# Patient Record
Sex: Female | Born: 1980 | Race: Black or African American | Hispanic: No | Marital: Single | Smoking: Never smoker
Health system: Southern US, Community
[De-identification: ages and names within clinical notes are randomized; demographics above are authoritative.]

## PROBLEM LIST (undated history)

## (undated) DIAGNOSIS — D649 Anemia, unspecified: Secondary | ICD-10-CM

## (undated) HISTORY — PX: HERNIA REPAIR: SHX51

---

## 2002-03-03 ENCOUNTER — Ambulatory Visit (HOSPITAL_COMMUNITY): Admission: RE | Admit: 2002-03-03 | Discharge: 2002-03-03 | Payer: Self-pay | Admitting: *Deleted

## 2002-08-06 ENCOUNTER — Inpatient Hospital Stay (HOSPITAL_COMMUNITY): Admission: AD | Admit: 2002-08-06 | Discharge: 2002-08-08 | Payer: Self-pay | Admitting: *Deleted

## 2005-10-17 ENCOUNTER — Emergency Department (HOSPITAL_COMMUNITY): Admission: EM | Admit: 2005-10-17 | Discharge: 2005-10-17 | Payer: Self-pay | Admitting: Emergency Medicine

## 2007-02-11 ENCOUNTER — Emergency Department (HOSPITAL_COMMUNITY): Admission: EM | Admit: 2007-02-11 | Discharge: 2007-02-11 | Payer: Self-pay | Admitting: Emergency Medicine

## 2008-07-26 ENCOUNTER — Emergency Department (HOSPITAL_COMMUNITY): Admission: EM | Admit: 2008-07-26 | Discharge: 2008-07-26 | Payer: Self-pay | Admitting: Emergency Medicine

## 2010-05-23 ENCOUNTER — Emergency Department (HOSPITAL_COMMUNITY)
Admission: EM | Admit: 2010-05-23 | Discharge: 2010-05-23 | Disposition: A | Discharge status: Home / Self Care | Payer: Medicaid Other | Attending: Emergency Medicine | Admitting: Emergency Medicine

## 2010-05-23 ENCOUNTER — Emergency Department (HOSPITAL_COMMUNITY): Payer: Medicaid Other

## 2010-05-23 DIAGNOSIS — R079 Chest pain, unspecified: Secondary | ICD-10-CM | POA: Insufficient documentation

## 2010-05-23 DIAGNOSIS — J984 Other disorders of lung: Secondary | ICD-10-CM | POA: Insufficient documentation

## 2010-05-23 LAB — URINALYSIS, ROUTINE W REFLEX MICROSCOPIC
Bilirubin Urine: NEGATIVE
Ketones, ur: 40 mg/dL — AB
Nitrite: NEGATIVE
Protein, ur: NEGATIVE mg/dL

## 2010-05-23 LAB — PREGNANCY, URINE: Preg Test, Ur: NEGATIVE

## 2010-06-10 LAB — URINALYSIS, ROUTINE W REFLEX MICROSCOPIC
Bilirubin Urine: NEGATIVE
Glucose, UA: NEGATIVE mg/dL
Hgb urine dipstick: NEGATIVE
Protein, ur: NEGATIVE mg/dL
Specific Gravity, Urine: 1.025 (ref 1.005–1.030)
Urobilinogen, UA: 1 mg/dL (ref 0.0–1.0)

## 2010-06-10 LAB — WET PREP, GENITAL
Trich, Wet Prep: NONE SEEN
Yeast Wet Prep HPF POC: NONE SEEN

## 2010-06-10 LAB — GC/CHLAMYDIA PROBE AMP, GENITAL
Chlamydia, DNA Probe: NEGATIVE
GC Probe Amp, Genital: NEGATIVE
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
## 2010-07-18 NOTE — Discharge Summary (Signed)
Daisy [name], [name]                            ACCOUNT NO.:  [id number]   MEDICAL RECORD NO.:  [id number]                   PATIENT TYPE:  INP   LOCATION:  9121                                 [facility]ILITY:  [facility]   PHYSICIAN:  Hal Morales, M.D.             DATE OF BIRTH:  10-21-1980   DATE OF ADMISSION:  08/06/2002  DATE OF DISCHARGE:  08/08/2002                                 DISCHARGE SUMMARY   ADMISSION DIAGNOSES:  1. Intrauterine pregnancy at 41 weeks.  2. Early labor.  3. Very difficult pelvic examination.   DISCHARGE DIAGNOSES:  1. Intrauterine pregnancy at 41 weeks.  2. Early labor.  3. Very difficult pelvic examination.  4. Status post vacuum assisted vaginal delivery of a viable female infant,     bottle feeding, and desires Ortho-Evra patch for contraception.   PROCEDURE:  Vacuum assisted vaginal delivery of a viable female infant with  Apgars of 7 and 8 who weighed 7 pounds 8 ounces on August 06, 2002 attended by  Naima A. Dillard, M.D. and Rica Koyanagi, C.N.M.   HOSPITAL COURSE:  The patient is a 30 year old single black female gravida  2, para 0-0-1-0 at [redacted] weeks gestation who presented complaining of uterine  contractions every three to four minutes for the last several hours.  She  was very difficult to examine secondary to her discomfort, but appeared to  be 1-2 cm, 75% effaced, and vertex but patient was admitted secondary to  being 41 weeks and very uncomfortable with her uterine contractions.  She  was given an epidural for labor comfort and to facilitate pelvic  examination.  The patient progressed to 4-5 cm by 7 a.m. on August 06, 2002 and  by 12:30 a.m. was complete at a +1 station and started pushing.  She pushed  for over four hours with minimal progress and the last hour had a quarter  size of the fetal head visible at the introitus for greater than an hour and  was exhausted.  At this point the patient was offered a vacuum assisted  vaginal  delivery by Naima A. Dillard, M.D. which she accepted and underwent  the same for delivery of a viable female infant who had Apgars of 7 and 8  and weighed 7 pounds 8 ounces.  Postpartum the patient has done well.  She  is now ambulating, voiding, and eating without difficulty.  She denies any  syncopal episodes or dizziness.  She is bottle feeding and plans Ortho-Evra  for contraception.  Her vital signs have been stable and she has been  afebrile throughout her postpartum stay.  She is deemed ready for discharge  today.   DISCHARGE INSTRUCTIONS:  As per the [facility] OB[facility] handout.   DISCHARGE MEDICATIONS:  1. Motrin 600 mg p.o. q.6h. p.r.n. for pain in liquid form as patient's     preference.  2. Tylenol No.3 suspension  for pain.  3. Ortho-Evra patch for contraception.    DISCHARGE FOLLOWUP:  Six weeks at [facility] or p.r.n.   DISCHARGE LABORATORIES:  Her hemoglobin is 7.9, WBC count 15.8, platelets  290,000.     Daisy [name], C.N.M.              Hal Morales, M.D.    SJD/MEDQ  D:  08/08/2002  T:  08/08/2002  Job:  [id number]

## 2010-11-27 ENCOUNTER — Emergency Department (HOSPITAL_COMMUNITY)
Admission: EM | Admit: 2010-11-27 | Discharge: 2010-11-27 | Disposition: A | Discharge status: Home / Self Care | Payer: Medicaid Other | Attending: Emergency Medicine | Admitting: Emergency Medicine

## 2010-11-27 ENCOUNTER — Emergency Department (HOSPITAL_COMMUNITY): Payer: Medicaid Other

## 2010-11-27 DIAGNOSIS — R079 Chest pain, unspecified: Secondary | ICD-10-CM | POA: Insufficient documentation

## 2010-11-27 DIAGNOSIS — R918 Other nonspecific abnormal finding of lung field: Secondary | ICD-10-CM | POA: Insufficient documentation

## 2010-11-27 DIAGNOSIS — K219 Gastro-esophageal reflux disease without esophagitis: Secondary | ICD-10-CM | POA: Insufficient documentation

## 2010-11-27 DIAGNOSIS — R000 Tachycardia, unspecified: Secondary | ICD-10-CM | POA: Insufficient documentation

## 2010-11-27 DIAGNOSIS — R059 Cough, unspecified: Secondary | ICD-10-CM | POA: Insufficient documentation

## 2010-11-27 DIAGNOSIS — R05 Cough: Secondary | ICD-10-CM | POA: Insufficient documentation

## 2010-11-27 LAB — POCT I-STAT, CHEM 8
Creatinine, Ser: 0.7 mg/dL (ref 0.50–1.10)
Glucose, Bld: 88 mg/dL (ref 70–99)
HCT: 43 % (ref 36.0–46.0)
Hemoglobin: 14.6 g/dL (ref 12.0–15.0)
Potassium: 3.9 mEq/L (ref 3.5–5.1)
TCO2: 24 mmol/L (ref 0–100)

## 2010-11-27 MED ORDER — IOHEXOL 300 MG/ML  SOLN
80.0000 mL | Freq: Once | INTRAMUSCULAR | Status: AC | PRN
  Administered 2010-11-27: 80 mL via INTRAVENOUS

## 2010-12-08 LAB — DIFFERENTIAL
Basophils Relative: 1
Eosinophils Absolute: 0.2
Lymphs Abs: 3
Neutro Abs: 6.7
Neutrophils Relative %: 62

## 2010-12-08 LAB — CBC
HCT: 38
Hemoglobin: 13
MCHC: 34.2
MCV: 88.2
RBC: 4.31
RDW: 12.9

## 2010-12-08 LAB — COMPREHENSIVE METABOLIC PANEL
ALT: 11
BUN: 6
CO2: 27
Calcium: 9.6
GFR calc non Af Amer: >60
Glucose, Bld: 108 — ABNORMAL HIGH
Sodium: 136
Total Protein: 7.6

## 2010-12-08 LAB — URINALYSIS, ROUTINE W REFLEX MICROSCOPIC
Bilirubin Urine: NEGATIVE
Glucose, UA: NEGATIVE
Hgb urine dipstick: NEGATIVE
Ketones, ur: NEGATIVE
Protein, ur: NEGATIVE
Urobilinogen, UA: 1

## 2010-12-08 LAB — WET PREP, GENITAL: Yeast Wet Prep HPF POC: NONE SEEN

## 2010-12-08 LAB — GC/CHLAMYDIA PROBE AMP, GENITAL
Chlamydia, DNA Probe: NEGATIVE
GC Probe Amp, Genital: NEGATIVE

## 2010-12-08 LAB — LIPASE, BLOOD: Lipase: 46

## 2012-12-18 IMAGING — CT CT CHEST W/ CM
1 of 2 series · 14 of 29 positions shown, 18 images · IV contrast (APPLIED)
Comparison: Chest x-ray 11/27/2010

CLINICAL DATA: Cough, chest pain.

CT CHEST WITH CONTRAST
TECHNIQUE: Multidetector CT imaging of the chest was performed
following the standard protocol during bolus administration of
intravenous contrast.
Contrast: 80mL OMNIPAQUE IOHEXOL 300 MG/ML IV SOLN

[Series 2: chest with st · axial · 0.69mm/px · z∈[+1040,+1236]mm · 14 of 47 slices shown, 18 images]
[im 4/47  mediastinal]
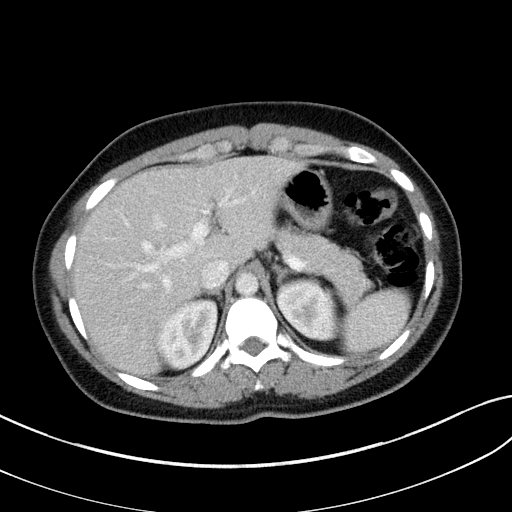
[im 4/47  lung]
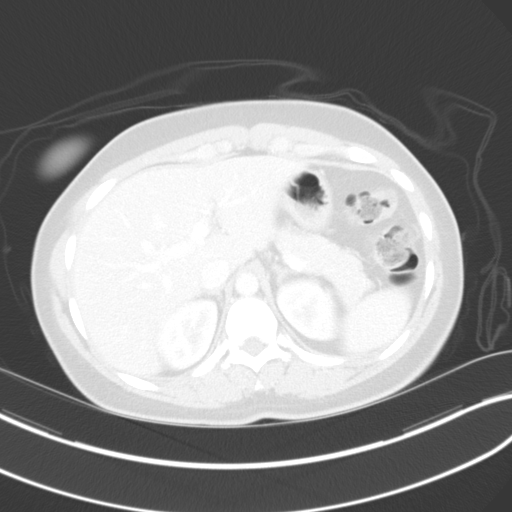
[im 7/47  lung]
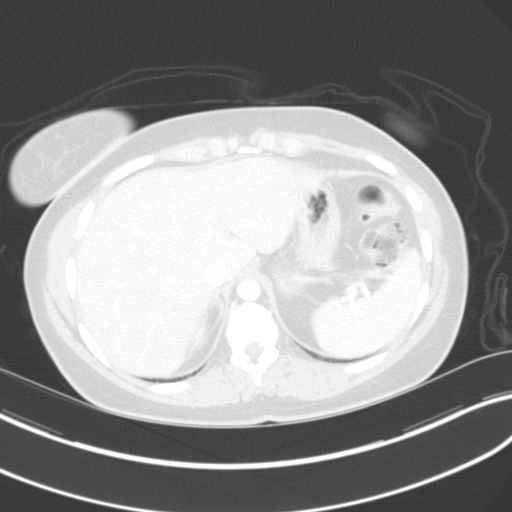
[im 10/47  lung]
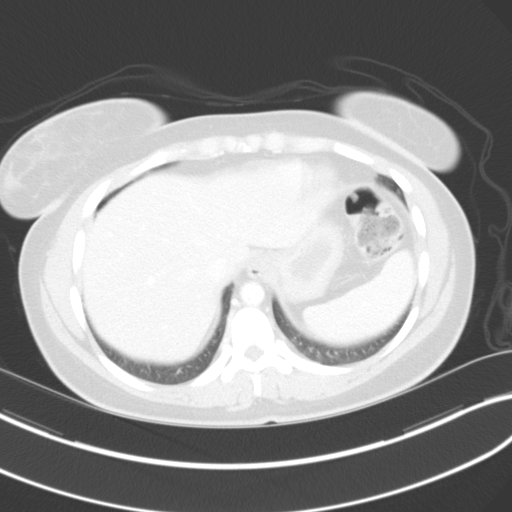
[im 14/47  lung]
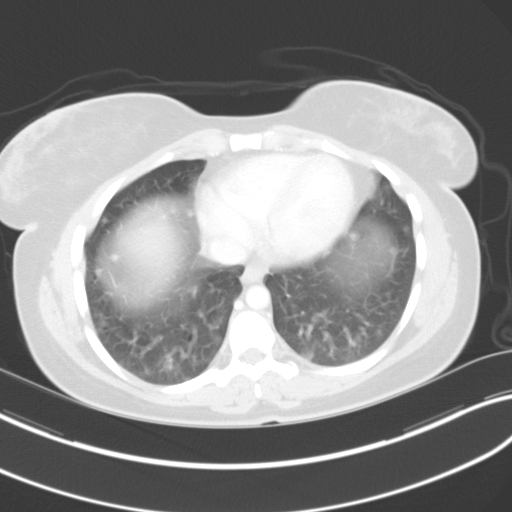
[im 17/47  mediastinal]
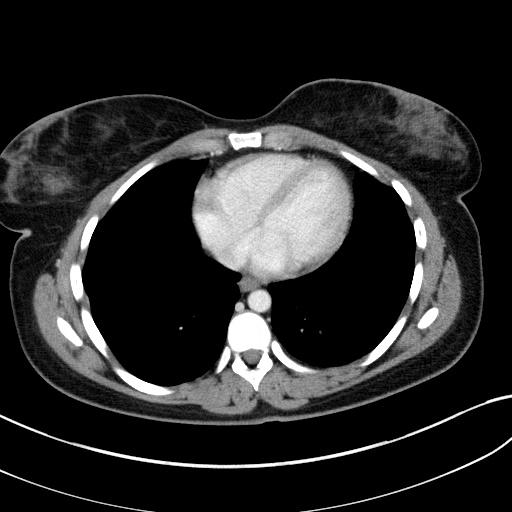
[im 17/47  lung]
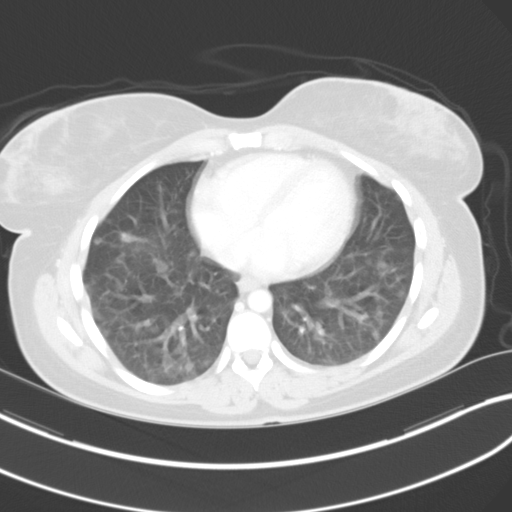
[im 20/47  lung]
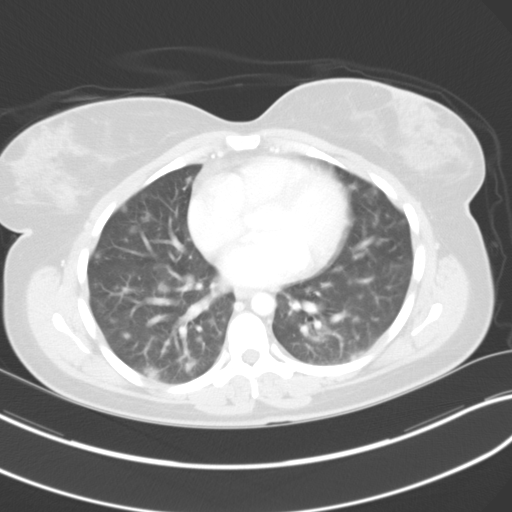
[im 22/47  lung]
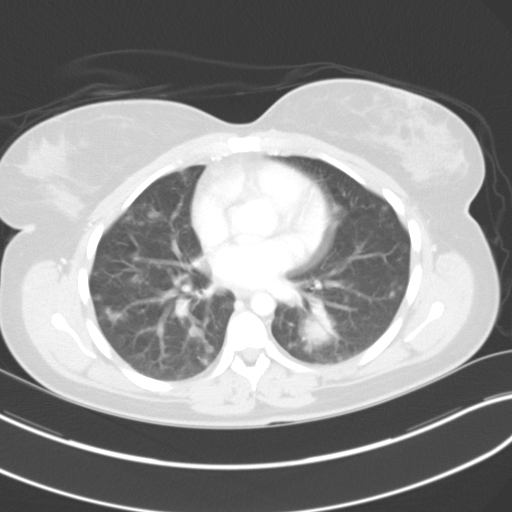
[im 24/47  lung]
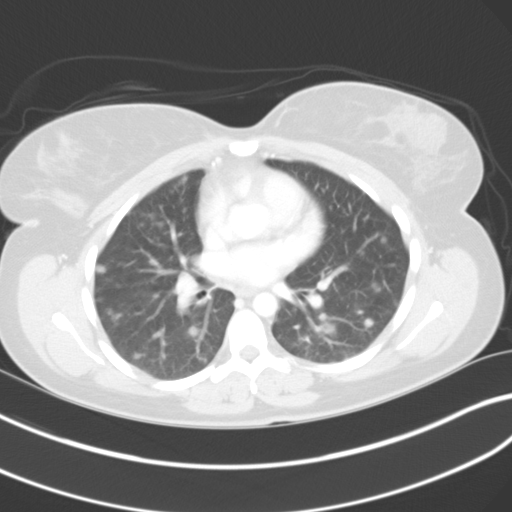
[im 27/47  mediastinal]
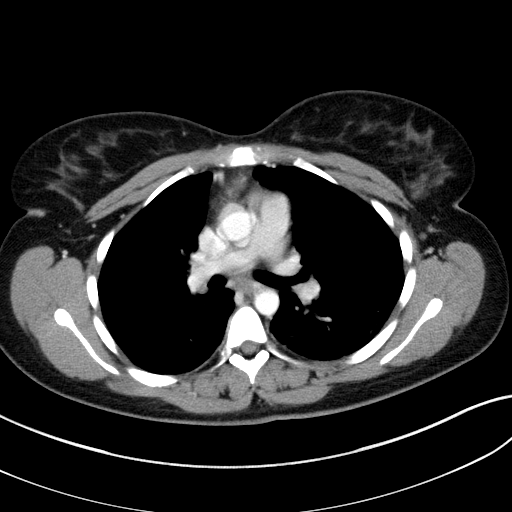
[im 27/47  lung]
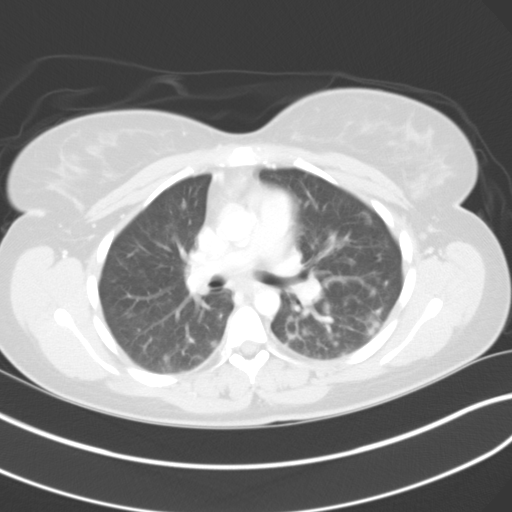
[im 30/47  lung]
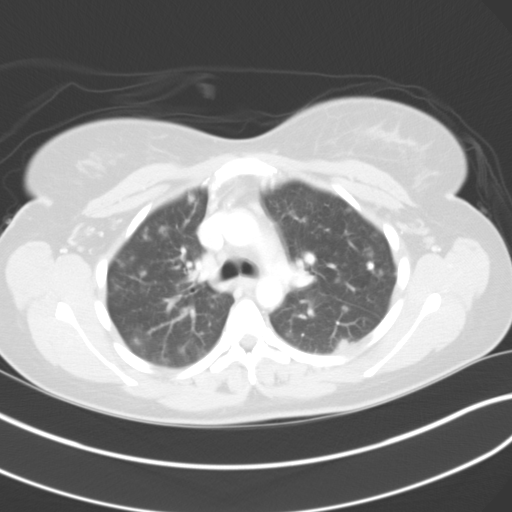
[im 33/47  lung]
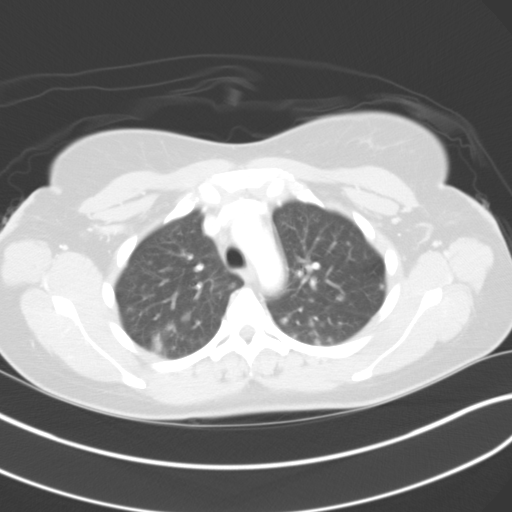
[im 37/47  lung]
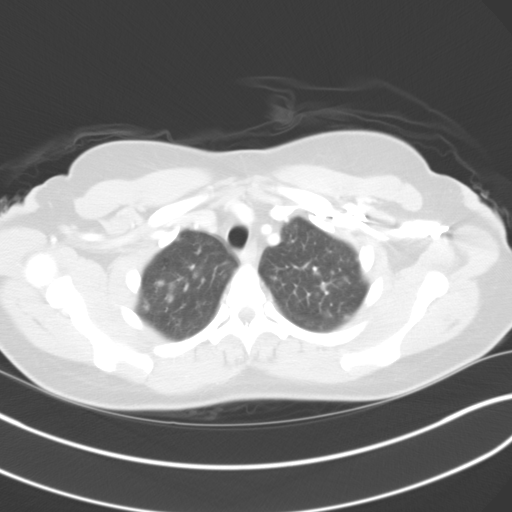
[im 40/47  mediastinal]
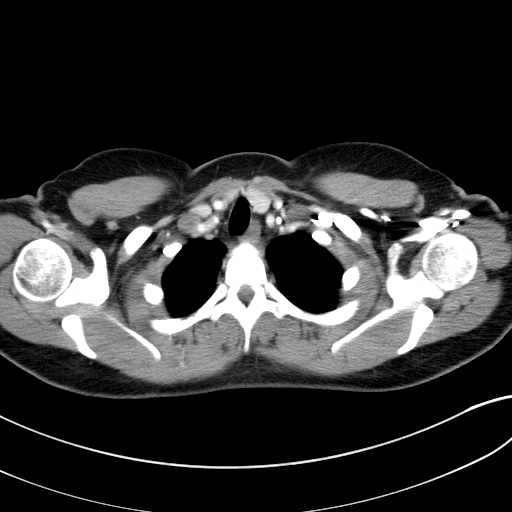
[im 40/47  lung]
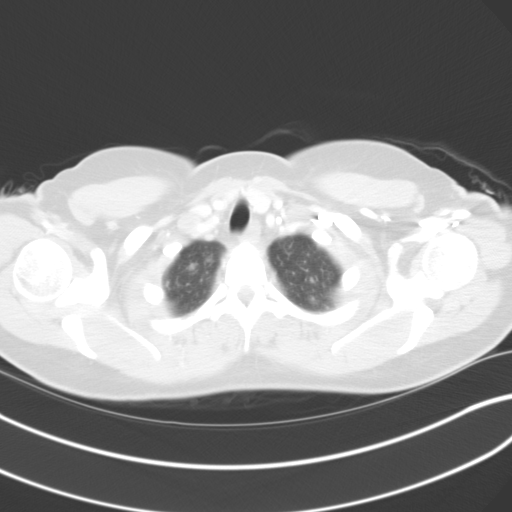
[im 43/47  lung]
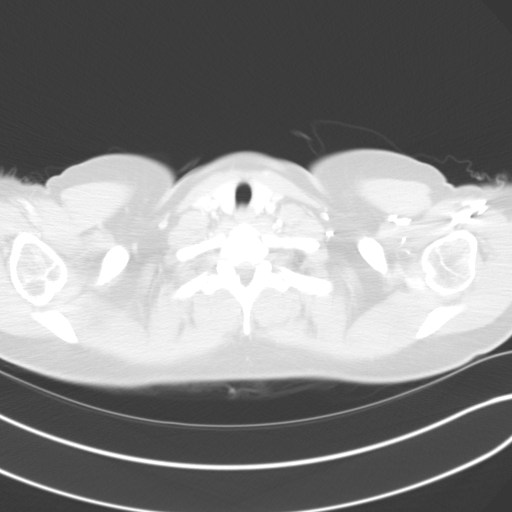

[14 of 29 positions shown; findings below may reference images not displayed]

FINDINGS: There are numerous bilateral pulmonary nodules.  The
largest is in the left lower lobe measuring 2.3 cm on image 26.
These are scattered throughout both lungs involving all lobes.
These nodules are nonspecific and could be inflammatory, infectious
or neoplastic.  Calcified granuloma noted in the upper lobes
bilaterally.  No pleural effusions. Heart is normal size. Aorta is
normal caliber. No mediastinal, hilar, or axillary adenopathy.
Visualized thyroid and chest wall soft tissues unremarkable.
Imaging into the upper abdomen shows no acute findings.
IMPRESSION: Numerous bilateral pulmonary nodules, most of which are
subcentimeteric in size.  The largest is 2.2 cm in the left lower
lobe.  These are nonspecific.  I favor these are infectious or
inflammatory, but metastases cannot be excluded.  No associated
additional abnormality.

## 2012-12-27 ENCOUNTER — Ambulatory Visit
Admission: RE | Admit: 2012-12-27 | Discharge: 2012-12-27 | Disposition: A | Discharge status: Home / Self Care | Payer: No Typology Code available for payment source | Source: Ambulatory Visit | Attending: Infectious Disease | Admitting: Infectious Disease

## 2012-12-27 ENCOUNTER — Other Ambulatory Visit: Payer: Self-pay | Admitting: Infectious Disease

## 2012-12-27 DIAGNOSIS — R7611 Nonspecific reaction to tuberculin skin test without active tuberculosis: Secondary | ICD-10-CM

## 2013-12-22 ENCOUNTER — Emergency Department (HOSPITAL_COMMUNITY)
Admission: EM | Admit: 2013-12-22 | Discharge: 2013-12-22 | Disposition: A | Discharge status: Home / Self Care | Payer: Medicaid Other | Attending: Emergency Medicine | Admitting: Emergency Medicine

## 2013-12-22 ENCOUNTER — Encounter (HOSPITAL_COMMUNITY): Payer: Self-pay | Admitting: Emergency Medicine

## 2013-12-22 DIAGNOSIS — J029 Acute pharyngitis, unspecified: Secondary | ICD-10-CM | POA: Insufficient documentation

## 2013-12-22 LAB — RAPID STREP SCREEN (MED CTR MEBANE ONLY): Streptococcus, Group A Screen (Direct): NEGATIVE

## 2013-12-22 MED ORDER — HYDROCODONE-ACETAMINOPHEN 7.5-325 MG/15ML PO SOLN: 15.0000 mL | ORAL | Status: DC | PRN

## 2013-12-22 MED ORDER — LIDOCAINE VISCOUS 2 % MT SOLN: 20.0000 mL | OROMUCOSAL | Status: DC | PRN

## 2013-12-22 NOTE — Discharge Instructions (Signed)

## 2013-12-22 NOTE — ED Provider Notes (Signed)
Medical screening examination/treatment/procedure(s) were performed by non-physician practitioner and as supervising physician I was immediately available for consultation/collaboration.   EKG Interpretation None      Devoria AlbeIva Jules Vidovich, MD, Armando GangFACEP   Ward GivensIva L Ladislaus Repsher, MD 12/22/13 [phone]

## 2013-12-22 NOTE — ED Provider Notes (Signed)
CSN: [id number]     Arrival date & time 12/22/13  1725 History   First MD Initiated Contact with Patient 12/22/13 1737     Chief Complaint  Patient presents with  . Sore Throat     (Consider location/radiation/quality/duration/timing/severity/associated sxs/prior Treatment) HPI  Patient to the ED with complaitns of sore throat for two days. She has tried OTC flu medication without relief. She reports the pain is severe. Denies throat swelling or being unable to tolerate secretions. She denies lateralization of pain. Denies fevers, chills, n/v/d.  History reviewed. No pertinent past medical history. Past Surgical History  Procedure Laterality Date  . Hernia repair     No family history on file. History  Substance Use Topics  . Smoking status: Never Smoker   . Smokeless tobacco: Not on file  . Alcohol Use: No   OB History   Grav Para Term Preterm Abortions TAB SAB Ect Mult Living                 Review of Systems   Review of Systems  Gen: no weight loss, fevers, chills, night sweats  Eyes: no occular draining, occular pain,  No visual changes  Nose: no epistaxis or rhinorrhea  Mouth: no dental pain, + sore throat  Neck: no neck pain  Lungs: No hemoptysis. No wheezing or coughing CV:  No palpitations, dependent edema or orthopnea. No chest pain Abd: no diarrhea. No nausea or vomiting, No abdominal pain  GU: no dysuria or gross hematuria  MSK:  No muscle weakness, No muscular pain Neuro: no headache, no focal neurologic deficits  Skin: no rash , no wounds Psyche: no complaints of depression or anxiety    Allergies  Review of patient's allergies indicates no known allergies.  Home Medications   Prior to Admission medications   Medication Sig Start Date End Date Taking? Authorizing Provider  HYDROcodone-acetaminophen (HYCET) 7.5-325 mg/15 ml solution Take 15 mLs by mouth every 4 (four) hours as needed for moderate pain or severe pain. 12/22/13   Sheleen Conchas Irine SealG Castle Lamons,  PA-C  lidocaine (XYLOCAINE) 2 % solution Use as directed 20 mLs in the mouth or throat as needed for mouth pain. 12/22/13   Sophea Rackham Irine SealG Iridessa Harrow, PA-C   BP 144/87  Pulse 93  Temp(Src) 98.7 F (37.1 C) (Oral)  Resp 18  SpO2 100%  LMP 12/22/2013 Physical Exam  Nursing note and vitals reviewed. Constitutional: She is oriented to person, place, and time. She appears well-developed and well-nourished. No distress.  HENT:  Head: Normocephalic and atraumatic. Head is without raccoon's eyes, without Battle's sign, without abrasion, without contusion, without laceration, without right periorbital erythema and without left periorbital erythema.  Right Ear: Tympanic membrane, external ear and ear canal normal. No hemotympanum.  Left Ear: Tympanic membrane, external ear and ear canal normal.  Nose: Nose normal. No rhinorrhea. Right sinus exhibits no maxillary sinus tenderness and no frontal sinus tenderness. Left sinus exhibits no maxillary sinus tenderness and no frontal sinus tenderness.  Mouth/Throat: Uvula is midline and mucous membranes are normal. No trismus in the jaw. Normal dentition. No dental abscesses or uvula swelling. No oropharyngeal exudate, posterior oropharyngeal edema, posterior oropharyngeal erythema or tonsillar abscesses.  No submental edema, tongue not elevated, no trismus. No impending airway obstruction; Pt able to speak full sentences, swallow intact, no drooling, stridor, or tonsillar/uvula displacement. No palatal petechia  Eyes: Conjunctivae and EOM are normal. Pupils are equal, round, and reactive to light.  Neck: Trachea normal, normal range of  motion and full passive range of motion without pain. Neck supple. No spinous process tenderness and no muscular tenderness present. No rigidity. Normal range of motion present. No Brudzinski's sign noted.  Flexion and extension of neck without pain or difficulty. Able to breath without difficulty in extension.  Cardiovascular: Normal  rate and regular rhythm.   Pulmonary/Chest: Effort normal and breath sounds normal. No stridor. No respiratory distress. She has no decreased breath sounds. She has no wheezes.  Abdominal: Soft. Bowel sounds are normal. There is no tenderness. There is no guarding.  No obvious evidence of splenomegaly. Non ttp.   Musculoskeletal: Normal range of motion.  Lymphadenopathy:       Head (right side): No preauricular and no posterior auricular adenopathy present.       Head (left side): No preauricular and no posterior auricular adenopathy present.    She has cervical adenopathy.  Neurological: She is alert and oriented to person, place, and time.  Skin: Skin is warm and dry. No rash noted. She is not diaphoretic.  Psychiatric: She has a normal mood and affect. Her speech is normal.    ED Course  Procedures (including critical care time) Labs Review Labs Reviewed  RAPID STREP SCREEN  CULTURE, GROUP A STREP    Imaging Review No results found.   EKG Interpretation None      MDM   Final diagnoses:  Pharyngitis    Negative strep screen. Normal PE. Will treat patients pain. Advised to use supportive care.  33 y.o.Edee Weichert's evaluation in the Emergency Department is complete. It has been determined that no acute conditions requiring further emergency intervention are present at this time. The patient/guardian have been advised of the diagnosis and plan. We have discussed signs and symptoms that warrant return to the ED, such as changes or worsening in symptoms.  Vital signs are stable at discharge. Filed Vitals:   12/22/13 1735  BP: 144/87  Pulse: 93  Temp: 98.7 F (37.1 C)  Resp: 18    Patient/guardian has voiced understanding and agreed to follow-up with the PCP or specialist.    Dorthula Matasiffany G Nealy Karapetian, PA-C 12/22/13 1917

## 2013-12-22 NOTE — ED Notes (Signed)
Per pt, states sore throat for three days

## 2013-12-24 LAB — CULTURE, GROUP A STREP

## 2015-01-18 IMAGING — CR DG CHEST 1V
1 series · 1 of 1 positions shown · non-contrast
Comparison: CT chest dated 11/27/2010

CLINICAL DATA: Positive TB test

EXAM:
CHEST - 1 VIEW

[w chest pa *]
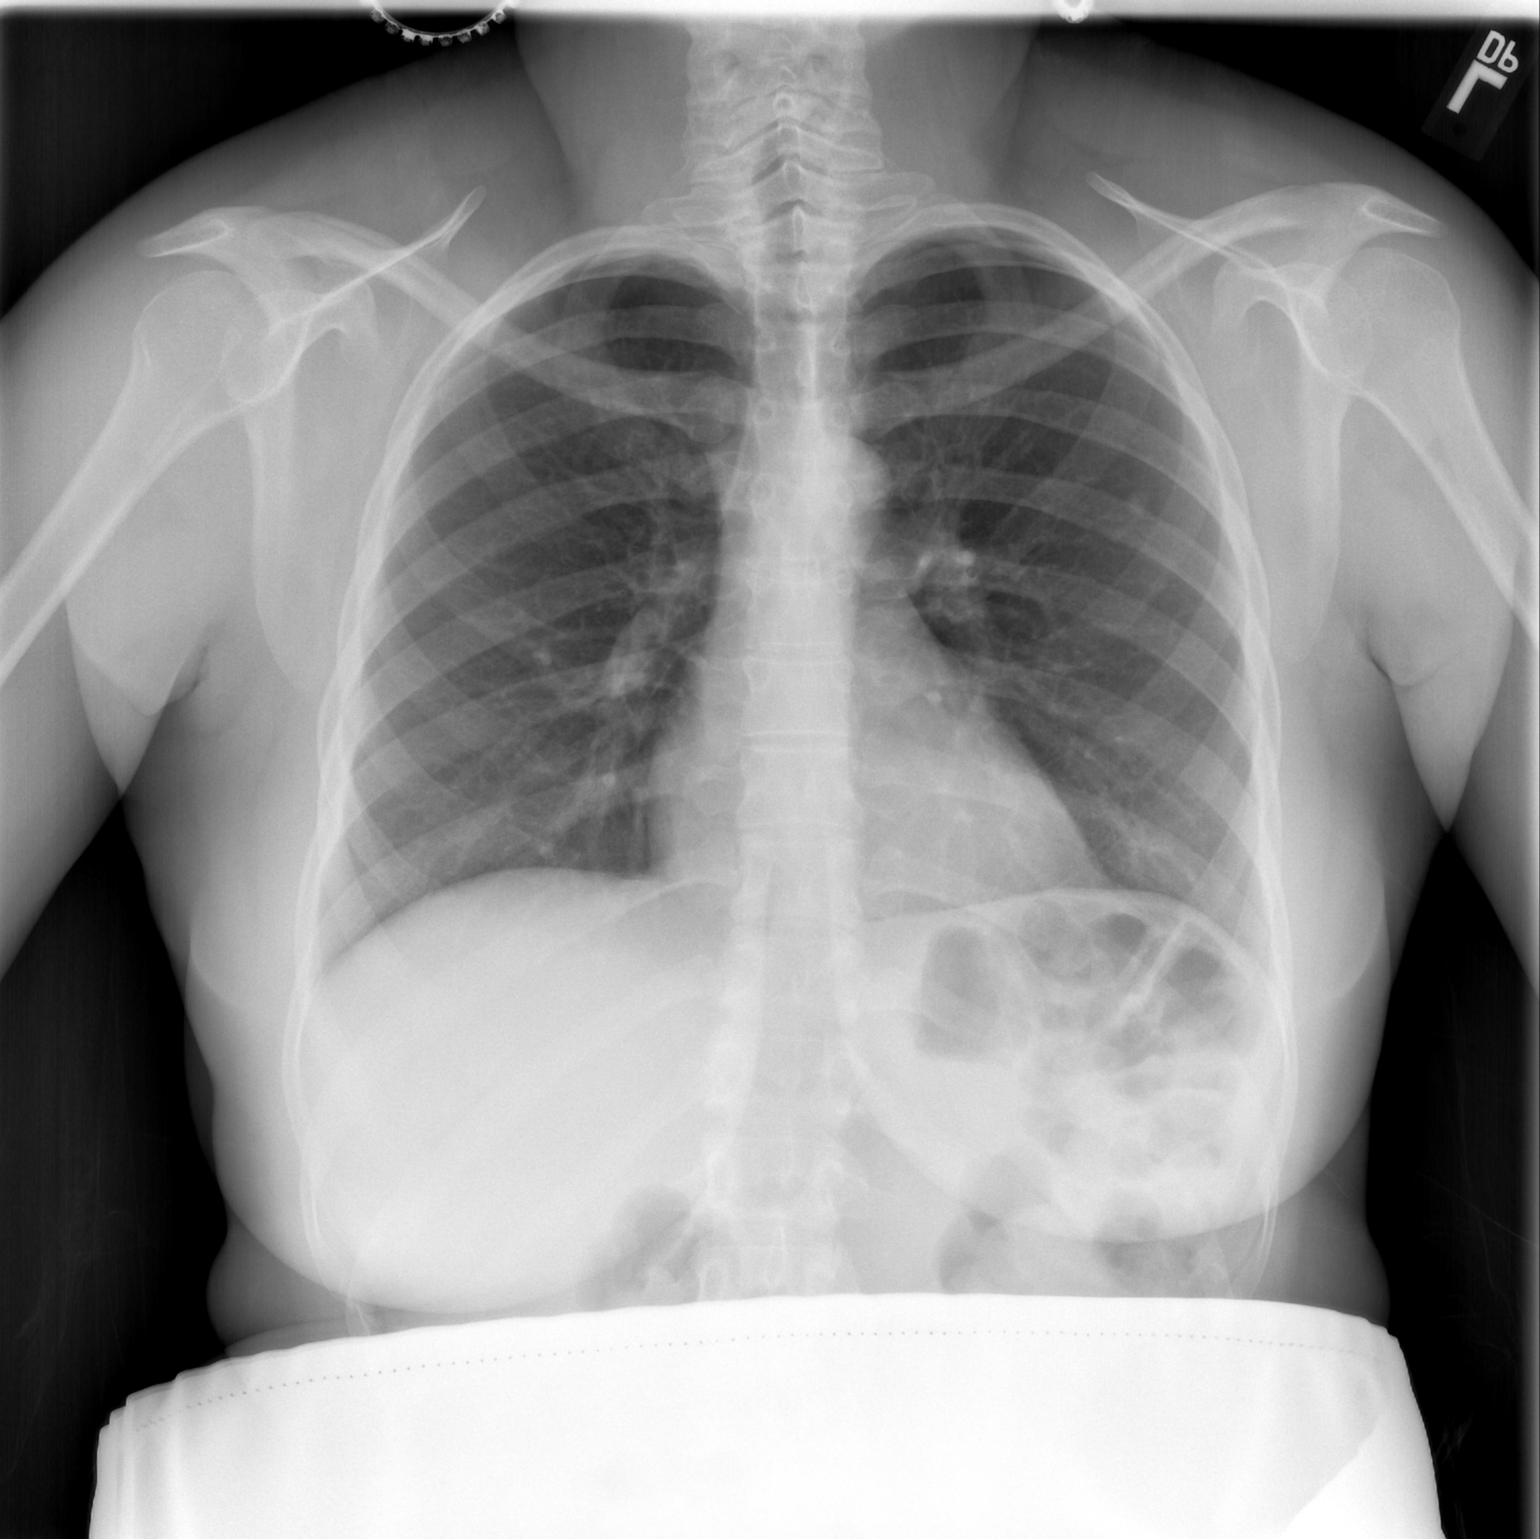

[1 of 1 positions shown; findings below may reference images not displayed]

FINDINGS: 8 mm left upper lobe pulmonary nodule. This is favored to reflect a
calcified granuloma when correlating with prior CT. Additional
numerous bilateral pulmonary nodules have resolved.

Lungs otherwise clear. No pleural effusion or pneumothorax.

The heart is normal in size.
IMPRESSION: No evidence of acute cardiopulmonary disease.

8 mm left upper lobe pulmonary nodule, favored to reflect a
calcified granuloma when correlating with prior CT.

## 2017-06-15 ENCOUNTER — Ambulatory Visit: Payer: Medicaid Other | Admitting: Physician Assistant

## 2017-06-16 ENCOUNTER — Encounter: Payer: Self-pay | Admitting: Physician Assistant

## 2019-12-07 LAB — HM HEPATITIS C SCREENING LAB: HM Hepatitis Screen: NEGATIVE

## 2020-12-05 DIAGNOSIS — Z62820 Parent-biological child conflict: Secondary | ICD-10-CM | POA: Diagnosis not present

## 2020-12-05 DIAGNOSIS — F4321 Adjustment disorder with depressed mood: Secondary | ICD-10-CM | POA: Diagnosis not present

## 2020-12-12 DIAGNOSIS — Z62820 Parent-biological child conflict: Secondary | ICD-10-CM | POA: Diagnosis not present

## 2020-12-12 DIAGNOSIS — F4321 Adjustment disorder with depressed mood: Secondary | ICD-10-CM | POA: Diagnosis not present

## 2021-01-02 DIAGNOSIS — F4321 Adjustment disorder with depressed mood: Secondary | ICD-10-CM | POA: Diagnosis not present

## 2021-01-02 DIAGNOSIS — Z62820 Parent-biological child conflict: Secondary | ICD-10-CM | POA: Diagnosis not present

## 2021-01-09 ENCOUNTER — Other Ambulatory Visit (HOSPITAL_COMMUNITY)
Admission: RE | Admit: 2021-01-09 | Discharge: 2021-01-09 | Disposition: A | Discharge status: Home / Self Care | Payer: BLUE CROSS/BLUE SHIELD | Source: Ambulatory Visit | Attending: Nurse Practitioner | Admitting: Nurse Practitioner

## 2021-01-09 ENCOUNTER — Encounter: Payer: Self-pay | Admitting: Nurse Practitioner

## 2021-01-09 ENCOUNTER — Other Ambulatory Visit: Payer: Self-pay

## 2021-01-09 ENCOUNTER — Ambulatory Visit (INDEPENDENT_AMBULATORY_CARE_PROVIDER_SITE_OTHER): Payer: BLUE CROSS/BLUE SHIELD | Admitting: Nurse Practitioner

## 2021-01-09 VITALS — BP 110/80 | HR 85 | Temp 97.9°F | Ht 62.75 in | Wt 191.2 lb

## 2021-01-09 DIAGNOSIS — Z113 Encounter for screening for infections with a predominantly sexual mode of transmission: Secondary | ICD-10-CM

## 2021-01-09 DIAGNOSIS — B9689 Other specified bacterial agents as the cause of diseases classified elsewhere: Secondary | ICD-10-CM | POA: Diagnosis not present

## 2021-01-09 DIAGNOSIS — Z62820 Parent-biological child conflict: Secondary | ICD-10-CM | POA: Diagnosis not present

## 2021-01-09 DIAGNOSIS — F4321 Adjustment disorder with depressed mood: Secondary | ICD-10-CM | POA: Diagnosis not present

## 2021-01-09 DIAGNOSIS — N898 Other specified noninflammatory disorders of vagina: Secondary | ICD-10-CM | POA: Diagnosis not present

## 2021-01-09 DIAGNOSIS — N760 Acute vaginitis: Secondary | ICD-10-CM

## 2021-01-09 DIAGNOSIS — F411 Generalized anxiety disorder: Secondary | ICD-10-CM | POA: Insufficient documentation

## 2021-01-09 DIAGNOSIS — F4323 Adjustment disorder with mixed anxiety and depressed mood: Secondary | ICD-10-CM

## 2021-01-09 NOTE — Patient Instructions (Signed)
Thank you for choosing [facility] ° °Go to lab for blood draw ° °Sign medical release form to get your records from previous providers. °

## 2021-01-09 NOTE — Assessment & Plan Note (Signed)
Started CBT 1week ago PHQ-5 GAD-2

## 2021-01-09 NOTE — Progress Notes (Signed)
Subjective:  Patient ID: Daisy [name], female    DOB: 12-10-1980  Age: 40 y.o. MRN: [id number]  CC: Establish Care (New patient/ Pt c/o vaginal discharge, milky white in color x 2 months, pt was using boric acid but states it stopped working. Denies odor, itching, or burning. Pt states she noticed that when she does not have a bowel movement the vaginal discharge is worse and heavy but any other time it comes and goes. /Declines all vaccines.)  Transfer from [facility].  Vaginal Discharge The patient's primary symptoms include a genital odor and vaginal discharge. The patient's pertinent negatives include no genital itching, genital lesions, genital rash, missed menses, pelvic pain or vaginal bleeding. This is a recurrent problem. The current episode started more than 1 month ago. The problem occurs constantly. The problem has been waxing and waning. The patient is experiencing no pain. The problem affects both sides. She is not pregnant. Associated symptoms include painful intercourse. Pertinent negatives include no abdominal pain, anorexia, back pain, chills, constipation, diarrhea, discolored urine, dysuria, fever, flank pain, frequency, headaches, hematuria, joint pain, joint swelling, nausea, rash, sore throat, urgency or vomiting. The vaginal discharge was milky and thick. The vaginal bleeding is typical of menses. She has not been passing clots. She has not been passing tissue. The symptoms are aggravated by intercourse. Treatments tried: boric acid. The treatment provided no relief. She is sexually active. It is unknown whether or not her partner has an STD. She uses condoms for contraception. Her menstrual history has been regular. Her past medical history is significant for vaginosis. There is no history of endometriosis, herpes simplex, menorrhagia, metrorrhagia, miscarriage, ovarian cysts, PID, an STD or a terminated pregnancy.  Agreed to STD screen.  Depression screen PHQ 2/9  01/09/2021  Decreased Interest 1  Down, Depressed, Hopeless 1  PHQ - 2 Score 2  Altered sleeping 1  Tired, decreased energy 0  Change in appetite 0  Feeling bad or failure about yourself  1  Trouble concentrating 0  Moving slowly or fidgety/restless 0  Suicidal thoughts 1  PHQ-9 Score 5  Difficult doing work/chores Not difficult at all    GAD 7 : Generalized Anxiety Score 01/09/2021  Nervous, Anxious, on Edge 0  Control/stop worrying 1  Worry too much - different things 1  Trouble relaxing 0  Restless 0  Easily annoyed or irritable 0  Afraid - awful might happen 0  Total GAD 7 Score 2  Anxiety Difficulty Not difficult at all   Reviewed past Medical, Social and Family history today.  Outpatient Medications Prior to Visit  Medication Sig Dispense Refill   Ferrous Sulfate (IRON PO) Take by mouth.     HYDROcodone-acetaminophen (HYCET) 7.5-325 mg/15 ml solution Take 15 mLs by mouth every 4 (four) hours as needed for moderate pain or severe pain. (Patient not taking: Reported on 01/09/2021) 120 mL 0   lidocaine (XYLOCAINE) 2 % solution Use as directed 20 mLs in the mouth or throat as needed for mouth pain. (Patient not taking: Reported on 01/09/2021) 100 mL 0   Pheniramine-PE-APAP 20-10-650 MG PACK Take 1 Package by mouth every 6 (six) hours as needed (sore throat and cough). (Patient not taking: Reported on 01/09/2021)     Phenylephrine-DM-GG-APAP 5-10-200-325 MG/15ML LIQD Take 30 mLs by mouth every 6 (six) hours as needed (cough). (Patient not taking: Reported on 01/09/2021)     No facility-administered medications prior to visit.    ROS See HPI  Objective:  BP  110/80 (BP Location: Left Arm, Patient Position: Sitting, Cuff Size: Normal)   Pulse 85   Temp 97.9 F (36.6 C) (Temporal)   Ht 5' 2.75" (1.594 m)   Wt 191 lb 3.2 oz (86.7 kg)   LMP 12/16/2020 (Exact Date)   SpO2 100%   BMI 34.14 kg/m   Physical Exam Vitals reviewed. Exam conducted with a chaperone present.   Pulmonary:     Effort: Pulmonary effort is normal.  Genitourinary:    General: Normal vulva.     Exam position: Lithotomy position.     Labia:        Right: No rash, tenderness or lesion.        Left: No rash, tenderness or lesion.      Vagina: Vaginal discharge present. No erythema or tenderness.     Cervix: Discharge present. No cervical motion tenderness, friability, lesion or erythema.  Lymphadenopathy:     Lower Body: No right inguinal adenopathy. No left inguinal adenopathy.  Neurological:     Mental Status: She is alert and oriented to person, place, and time.    Assessment & Plan:  This visit occurred during the SARS-CoV-2 public health emergency.  Safety protocols were in place, including screening questions prior to the visit, additional usage of staff PPE, and extensive cleaning of exam room while observing appropriate contact time as indicated for disinfecting solutions.   Daisy [name] was seen today for establish care.  Diagnoses and all orders for this visit:  Vaginal discharge -     Cervicovaginal ancillary only( [place])  Screen for STD (sexually transmitted disease) -     Cervicovaginal ancillary only( [place]) -     HIV antibody (with reflex) -     RPR -     Hepatitis C Antibody Go to lab for blood draw Sign medical release form to get your records from previous providers.  Problem List Items Addressed This Visit   None Visit Diagnoses     Vaginal discharge    -  Primary   Relevant Orders   Cervicovaginal ancillary only( [place])   Screen for STD (sexually transmitted disease)       Relevant Orders   Cervicovaginal ancillary only( [place])   HIV antibody (with reflex)   RPR   Hepatitis C Antibody       Follow-up: Return in about 3 months (around 04/11/2021) for CPE (fasting).  Alysia Penna, NP

## 2021-01-10 LAB — HEPATITIS C ANTIBODY
Hepatitis C Ab: NONREACTIVE
SIGNAL TO CUT-OFF: 0.1 (ref <1.00)

## 2021-01-10 LAB — CERVICOVAGINAL ANCILLARY ONLY
Bacterial Vaginitis (gardnerella): POSITIVE — AB
Candida Glabrata: NEGATIVE
Candida Vaginitis: NEGATIVE
Chlamydia: NEGATIVE
Comment: NEGATIVE
Comment: NEGATIVE
Comment: NEGATIVE
Comment: NEGATIVE
Comment: NEGATIVE
Comment: NORMAL
Neisseria Gonorrhea: NEGATIVE
Trichomonas: NEGATIVE

## 2021-01-10 LAB — HIV ANTIBODY (ROUTINE TESTING W REFLEX): HIV 1&2 Ab, 4th Generation: NONREACTIVE

## 2021-01-10 LAB — RPR: RPR Ser Ql: NONREACTIVE

## 2021-01-13 MED ORDER — METRONIDAZOLE 0.75 % VA GEL: 1.0000 | Freq: Every day | VAGINAL | 0 refills | Status: DC

## 2021-01-13 NOTE — Addendum Note (Signed)
Addended by: Alysia Penna L on: 01/13/2021 01:28 PM   Modules accepted: Orders

## 2021-01-14 ENCOUNTER — Telehealth: Payer: Self-pay | Admitting: Nurse Practitioner

## 2021-01-14 DIAGNOSIS — N760 Acute vaginitis: Secondary | ICD-10-CM

## 2021-01-14 DIAGNOSIS — B9689 Other specified bacterial agents as the cause of diseases classified elsewhere: Secondary | ICD-10-CM

## 2021-01-14 MED ORDER — METRONIDAZOLE 0.75 % VA GEL: 1.0000 | Freq: Every day | VAGINAL | 0 refills | Status: DC

## 2021-01-14 NOTE — Telephone Encounter (Signed)
Patient changed pharmacy and  needed new Rx sent into her new pharmacy.

## 2021-01-14 NOTE — Telephone Encounter (Signed)
Pt is wanting her metroNIDAZOLE (METROGEL VAGINAL) 0.75 % vaginal gel [[id number]] changed to [facility] on Address: [address], [place], [place] [address]. I informed her, she could call, she wants you to , she's at work.

## 2021-01-16 DIAGNOSIS — Z62820 Parent-biological child conflict: Secondary | ICD-10-CM | POA: Diagnosis not present

## 2021-01-16 DIAGNOSIS — F4321 Adjustment disorder with depressed mood: Secondary | ICD-10-CM | POA: Diagnosis not present

## 2021-01-20 ENCOUNTER — Encounter: Payer: Self-pay | Admitting: Nurse Practitioner

## 2021-01-20 DIAGNOSIS — D500 Iron deficiency anemia secondary to blood loss (chronic): Secondary | ICD-10-CM | POA: Insufficient documentation

## 2021-01-30 DIAGNOSIS — F4321 Adjustment disorder with depressed mood: Secondary | ICD-10-CM | POA: Diagnosis not present

## 2021-01-30 DIAGNOSIS — Z62820 Parent-biological child conflict: Secondary | ICD-10-CM | POA: Diagnosis not present

## 2021-02-06 DIAGNOSIS — Z62820 Parent-biological child conflict: Secondary | ICD-10-CM | POA: Diagnosis not present

## 2021-02-06 DIAGNOSIS — F4321 Adjustment disorder with depressed mood: Secondary | ICD-10-CM | POA: Diagnosis not present

## 2021-02-13 DIAGNOSIS — F4321 Adjustment disorder with depressed mood: Secondary | ICD-10-CM | POA: Diagnosis not present

## 2021-02-13 DIAGNOSIS — Z62820 Parent-biological child conflict: Secondary | ICD-10-CM | POA: Diagnosis not present

## 2021-02-20 DIAGNOSIS — F4321 Adjustment disorder with depressed mood: Secondary | ICD-10-CM | POA: Diagnosis not present

## 2021-02-20 DIAGNOSIS — Z62820 Parent-biological child conflict: Secondary | ICD-10-CM | POA: Diagnosis not present

## 2021-02-27 DIAGNOSIS — F4321 Adjustment disorder with depressed mood: Secondary | ICD-10-CM | POA: Diagnosis not present

## 2021-02-27 DIAGNOSIS — Z62820 Parent-biological child conflict: Secondary | ICD-10-CM | POA: Diagnosis not present

## 2021-03-06 DIAGNOSIS — Z62820 Parent-biological child conflict: Secondary | ICD-10-CM | POA: Diagnosis not present

## 2021-03-06 DIAGNOSIS — F4321 Adjustment disorder with depressed mood: Secondary | ICD-10-CM | POA: Diagnosis not present

## 2021-03-20 DIAGNOSIS — Z62820 Parent-biological child conflict: Secondary | ICD-10-CM | POA: Diagnosis not present

## 2021-03-20 DIAGNOSIS — F4321 Adjustment disorder with depressed mood: Secondary | ICD-10-CM | POA: Diagnosis not present

## 2021-03-27 DIAGNOSIS — Z62820 Parent-biological child conflict: Secondary | ICD-10-CM | POA: Diagnosis not present

## 2021-03-27 DIAGNOSIS — F4321 Adjustment disorder with depressed mood: Secondary | ICD-10-CM | POA: Diagnosis not present

## 2021-04-03 DIAGNOSIS — Z62820 Parent-biological child conflict: Secondary | ICD-10-CM | POA: Diagnosis not present

## 2021-04-03 DIAGNOSIS — F4321 Adjustment disorder with depressed mood: Secondary | ICD-10-CM | POA: Diagnosis not present

## 2021-04-10 ENCOUNTER — Encounter: Payer: Self-pay | Admitting: Nurse Practitioner

## 2021-04-10 ENCOUNTER — Ambulatory Visit (INDEPENDENT_AMBULATORY_CARE_PROVIDER_SITE_OTHER): Payer: BLUE CROSS/BLUE SHIELD | Admitting: Nurse Practitioner

## 2021-04-10 ENCOUNTER — Encounter: Payer: BLUE CROSS/BLUE SHIELD | Admitting: Nurse Practitioner

## 2021-04-10 ENCOUNTER — Other Ambulatory Visit (HOSPITAL_COMMUNITY)
Admission: RE | Admit: 2021-04-10 | Discharge: 2021-04-10 | Disposition: A | Discharge status: Home / Self Care | Payer: BLUE CROSS/BLUE SHIELD | Source: Ambulatory Visit | Attending: Nurse Practitioner | Admitting: Nurse Practitioner

## 2021-04-10 ENCOUNTER — Other Ambulatory Visit: Payer: Self-pay

## 2021-04-10 VITALS — BP 112/84 | HR 84 | Temp 96.8°F | Ht 62.75 in | Wt 189.0 lb

## 2021-04-10 DIAGNOSIS — Z113 Encounter for screening for infections with a predominantly sexual mode of transmission: Secondary | ICD-10-CM

## 2021-04-10 DIAGNOSIS — R739 Hyperglycemia, unspecified: Secondary | ICD-10-CM | POA: Diagnosis not present

## 2021-04-10 DIAGNOSIS — Z0001 Encounter for general adult medical examination with abnormal findings: Secondary | ICD-10-CM

## 2021-04-10 DIAGNOSIS — Z124 Encounter for screening for malignant neoplasm of cervix: Secondary | ICD-10-CM

## 2021-04-10 DIAGNOSIS — Z13220 Encounter for screening for lipoid disorders: Secondary | ICD-10-CM | POA: Diagnosis not present

## 2021-04-10 DIAGNOSIS — B9689 Other specified bacterial agents as the cause of diseases classified elsewhere: Secondary | ICD-10-CM

## 2021-04-10 DIAGNOSIS — F4321 Adjustment disorder with depressed mood: Secondary | ICD-10-CM | POA: Diagnosis not present

## 2021-04-10 DIAGNOSIS — N760 Acute vaginitis: Secondary | ICD-10-CM

## 2021-04-10 DIAGNOSIS — Z136 Encounter for screening for cardiovascular disorders: Secondary | ICD-10-CM | POA: Diagnosis not present

## 2021-04-10 DIAGNOSIS — Z803 Family history of malignant neoplasm of breast: Secondary | ICD-10-CM

## 2021-04-10 DIAGNOSIS — Z62820 Parent-biological child conflict: Secondary | ICD-10-CM | POA: Diagnosis not present

## 2021-04-10 NOTE — Assessment & Plan Note (Signed)
She states she is aware of her risk, but will rather not go for mammogram.

## 2021-04-10 NOTE — Patient Instructions (Signed)
Let me know if you change you mind about mammogram referral.  Start heart healthy diet and regular exercise  Go to lab for blood draw  Preventive Care 41-41 Years Old, Female Preventive care refers to lifestyle choices and visits with your health care provider that can promote health and wellness. Preventive care visits are also called wellness exams. What can I expect for my preventive care visit? Counseling Your health care provider may ask you questions about your: Medical history, including: Past medical problems. Family medical history. Pregnancy history. Current health, including: Menstrual cycle. Method of birth control. Emotional well-being. Home life and relationship well-being. Sexual activity and sexual health. Lifestyle, including: Alcohol, nicotine or tobacco, and drug use. Access to firearms. Diet, exercise, and sleep habits. Work and work Statistician. Sunscreen use. Safety issues such as seatbelt and bike helmet use. Physical exam Your health care provider will check your: Height and weight. These may be used to calculate your BMI (body mass index). BMI is a measurement that tells if you are at a healthy weight. Waist circumference. This measures the distance around your waistline. This measurement also tells if you are at a healthy weight and may help predict your risk of certain diseases, such as type 2 diabetes and high blood pressure. Heart rate and blood pressure. Body temperature. Skin for abnormal spots. What immunizations do I need? Vaccines are usually given at various ages, according to a schedule. Your health care provider will recommend vaccines for you based on your age, medical history, and lifestyle or other factors, such as travel or where you work. What tests do I need? Screening Your health care provider may recommend screening tests for certain conditions. This may include: Lipid and cholesterol levels. Diabetes screening. This is done by  checking your blood sugar (glucose) after you have not eaten for a while (fasting). Pelvic exam and Pap test. Hepatitis B test. Hepatitis C test. HIV (human immunodeficiency virus) test. STI (sexually transmitted infection) testing, if you are at risk. Lung cancer screening. Colorectal cancer screening. Mammogram. Talk with your health care provider about when you should start having regular mammograms. This may depend on whether you have a family history of breast cancer. BRCA-related cancer screening. This may be done if you have a family history of breast, ovarian, tubal, or peritoneal cancers. Bone density scan. This is done to screen for osteoporosis. Talk with your health care provider about your test results, treatment options, and if necessary, the need for more tests. Follow these instructions at home: Eating and drinking  Eat a diet that includes fresh fruits and vegetables, whole grains, lean protein, and low-fat dairy products. Take vitamin and mineral supplements as recommended by your health care provider. Do not drink alcohol if: Your health care provider tells you not to drink. You are pregnant, may be pregnant, or are planning to become pregnant. If you drink alcohol: Limit how much you have to 0-1 drink a day. Know how much alcohol is in your drink. In the [place] one drink equals one 12 oz bottle of beer (355 mL), one 5 oz glass of wine (148 mL), or one 1 oz glass of hard liquor (44 mL). Lifestyle Brush your teeth every morning and night with fluoride toothpaste. Floss one time each day. Exercise for at least 30 minutes 5 or more days each week. Do not use any products that contain nicotine or tobacco. These products include cigarettes, chewing tobacco, and vaping devices, such as e-cigarettes. If you need help quitting,  ask your health care provider. Do not use drugs. If you are sexually active, practice safe sex. Use a condom or other form of protection to prevent  STIs. If you do not wish to become pregnant, use a form of birth control. If you plan to become pregnant, see your health care provider for a prepregnancy visit. Take aspirin only as told by your health care provider. Make sure that you understand how much to take and what form to take. Work with your health care provider to find out whether it is safe and beneficial for you to take aspirin daily. Find healthy ways to manage stress, such as: Meditation, yoga, or listening to music. Journaling. Talking to a trusted person. Spending time with friends and family. Minimize exposure to UV radiation to reduce your risk of skin cancer. Safety Always wear your seat belt while driving or riding in a vehicle. Do not drive: If you have been drinking alcohol. Do not ride with someone who has been drinking. When you are tired or distracted. While texting. If you have been using any mind-altering substances or drugs. Wear a helmet and other protective equipment during sports activities. If you have firearms in your house, make sure you follow all gun safety procedures. Seek help if you have been physically or sexually abused. What's next? Visit your health care provider once a year for an annual wellness visit. Ask your health care provider how often you should have your eyes and teeth checked. Stay up to date on all vaccines. This information is not intended to replace advice given to you by your health care provider. Make sure you discuss any questions you have with your health care provider. Document Revised: 08/14/2020 Document Reviewed: 08/14/2020 Elsevier Patient Education  [place].

## 2021-04-10 NOTE — Progress Notes (Signed)
Subjective:    Patient ID: Daisy [name], female    DOB: June 28, 1980, 41 y.o.   MRN: [id number]  Patient presents today for CPE   HPI  Vision:up to date Dental:up to date Diet:regular Exercise:none Weight:  Wt Readings from Last 3 Encounters:  04/10/21 189 lb (85.7 kg)  01/09/21 191 lb 3.2 oz (86.7 kg)   Sexual History (orientation,birth control, marital status, STD):last PAP completed 2021 (normal PAP, no HPV test), requested repeat GC/chlamydia test, declined referral for mammogram despite Fhx of breast cancer (mother and M. Aunt)  Depression/Suicide: Depression screen [facility] 2/9 04/10/2021 01/09/2021  Decreased Interest 0 1  Down, Depressed, Hopeless 1 1  PHQ - 2 Score 1 2  Altered sleeping 0 1  Tired, decreased energy 0 0  Change in appetite 0 0  Feeling bad or failure about yourself  1 1  Trouble concentrating 0 0  Moving slowly or fidgety/restless 0 0  Suicidal thoughts 0 1  PHQ-9 Score 2 5  Difficult doing work/chores Not difficult at all Not difficult at all   GAD 7 : Generalized Anxiety Score 04/10/2021 01/09/2021  Nervous, Anxious, on Edge 0 0  Control/stop worrying 0 1  Worry too much - different things 0 1  Trouble relaxing 0 0  Restless 0 0  Easily annoyed or irritable 1 0  Afraid - awful might happen 0 0  Total GAD 7 Score 1 2  Anxiety Difficulty Not difficult at all Not difficult at all   Immunizations: (TDAP, Hep C screen, Pneumovax, Influenza, zoster)  Health Maintenance  Topic Date Due   COVID-19 Vaccine (1) 04/26/2021*   Flu Shot  05/30/2021*   Tetanus Vaccine  01/09/2022*   Pap Smear  12/07/2022   Hepatitis C Screening: USPSTF Recommendation to screen - Ages 18-79 yo.  Completed   HIV Screening  Completed   HPV Vaccine  Aged Out  *Topic was postponed. The date shown is not the original due date.   Fall Risk: Fall Risk  04/10/2021 01/09/2021  Falls in the past year? 0 0  Number falls in past yr: 0 0  Injury with Fall? 0 0  Risk for fall due to :  No Fall Risks No Fall Risks  Follow up Falls evaluation completed Falls evaluation completed   Medications and allergies reviewed with patient and updated if appropriate.  Patient Active Problem List   Diagnosis Date Noted   FHx: breast cancer in first degree relative 04/10/2021   Iron deficiency anemia secondary to blood loss (chronic) 01/20/2021   Adjustment disorder with mixed anxiety and depressed mood 01/09/2021    Current Outpatient Medications on File Prior to Visit  Medication Sig Dispense Refill   Ferrous Sulfate (IRON PO) Take by mouth.     No current facility-administered medications on file prior to visit.    History reviewed. No pertinent past medical history.  Past Surgical History:  Procedure Laterality Date   HERNIA REPAIR     Social History   Socioeconomic History   Marital status: Single    Spouse name: Not on file   Number of children: 1   Years of education: Not on file   Highest education level: Not on file  Occupational History   Not on file  Tobacco Use   Smoking status: Never   Smokeless tobacco: Never  Vaping Use   Vaping Use: Never used  Substance and Sexual Activity   Alcohol use: No   Drug use: Never   Sexual activity: Yes  Birth control/protection: Condom  Other Topics Concern   Not on file  Social History Narrative   Not on file   Social Determinants of Health   Financial Resource Strain: Not on file  Food Insecurity: Not on file  Transportation Needs: Not on file  Physical Activity: Not on file  Stress: Not on file  Social Connections: Not on file   Family History  Problem Relation Age of Onset   Cancer Mother 32       breast   Cancer Maternal Aunt         Review of Systems  Constitutional:  Negative for fever, malaise/fatigue and weight loss.  HENT:  Negative for congestion and sore throat.   Eyes:        Negative for visual changes  Respiratory:  Negative for cough and shortness of breath.   Cardiovascular:   Negative for chest pain, palpitations and leg swelling.  Gastrointestinal:  Negative for blood in stool, constipation, diarrhea and heartburn.  Genitourinary:  Negative for dysuria, frequency and urgency.  Musculoskeletal:  Negative for falls, joint pain and myalgias.  Skin:  Negative for rash.  Neurological:  Negative for dizziness, sensory change and headaches.  Endo/Heme/Allergies:  Does not bruise/bleed easily.  Psychiatric/Behavioral:  Negative for depression, substance abuse and suicidal ideas. The patient is not nervous/anxious.    Objective:   Vitals:   04/10/21 1337  BP: 112/84  Pulse: 84  Temp: (!) 96.8 F (36 C)  SpO2: 98%    Body mass index is 33.75 kg/m.   Physical Examination:  Physical Exam Exam conducted with a chaperone present.  Constitutional:      General: She is not in acute distress.    Appearance: She is obese.  HENT:     Right Ear: Tympanic membrane, ear canal and external ear normal.     Left Ear: Tympanic membrane, ear canal and external ear normal.  Eyes:     General: No scleral icterus.    Extraocular Movements: Extraocular movements intact.     Conjunctiva/sclera: Conjunctivae normal.     Pupils: Pupils are equal, round, and reactive to light.  Cardiovascular:     Rate and Rhythm: Normal rate and regular rhythm.     Pulses: Normal pulses.     Heart sounds: Normal heart sounds.  Pulmonary:     Effort: Pulmonary effort is normal. No respiratory distress.     Breath sounds: Normal breath sounds.  Chest:  Breasts:    Breasts are symmetrical.     Right: Normal.     Left: Normal.  Abdominal:     General: Bowel sounds are normal. There is no distension.     Palpations: Abdomen is soft.     Hernia: There is no hernia in the left inguinal area or right inguinal area.  Genitourinary:    General: Normal vulva.     Labia:        Right: No rash, tenderness or lesion.        Left: No rash, tenderness or lesion.      Urethra: No prolapse.      Vagina: Vaginal discharge present. No erythema, tenderness or bleeding.     Cervix: Discharge present. No cervical motion tenderness, friability, lesion, erythema or cervical bleeding.     Uterus: Normal.      Adnexa: Right adnexa normal and left adnexa normal.  Musculoskeletal:        General: Normal range of motion.     Cervical back: Normal  range of motion and neck supple.  Lymphadenopathy:     Cervical: No cervical adenopathy.     Upper Body:     Right upper body: No supraclavicular, axillary or pectoral adenopathy.     Left upper body: No supraclavicular, axillary or pectoral adenopathy.     Lower Body: No right inguinal adenopathy. No left inguinal adenopathy.  Skin:    General: Skin is warm and dry.  Neurological:     Mental Status: She is alert and oriented to person, place, and time.  Psychiatric:        Mood and Affect: Mood normal.        Behavior: Behavior normal.        Thought Content: Thought content normal.    ASSESSMENT and PLAN: This visit occurred during the SARS-CoV-2 public health emergency.  Safety protocols were in place, including screening questions prior to the visit, additional usage of staff PPE, and extensive cleaning of exam room while observing appropriate contact time as indicated for disinfecting solutions.   Jatoria was seen today for annual exam.  Diagnoses and all orders for this visit:  Encounter for preventative adult health care exam with abnormal findings -     Comprehensive metabolic panel -     CBC with Differential/Platelet -     Lipid panel -     TSH -     Cytology - PAP  Encounter for lipid screening for cardiovascular disease -     Lipid panel  Encounter for Papanicolaou smear for cervical cancer screening -     Cytology - PAP  Hyperglycemia -     Hemoglobin A1c  Screen for STD (sexually transmitted disease) -     Cervicovaginal ancillary only( [place])  FHx: breast cancer in first degree relative    Let me know if  you change you mind about mammogram referral. Start heart healthy diet and regular exercise  Problem List Items Addressed This Visit       Other   FHx: breast cancer in first degree relative    She states she is aware of her risk, but will rather not go for mammogram.      Other Visit Diagnoses     Encounter for preventative adult health care exam with abnormal findings    -  Primary   Relevant Orders   Comprehensive metabolic panel   CBC with Differential/Platelet   Lipid panel   TSH   Cytology - PAP   Encounter for lipid screening for cardiovascular disease       Relevant Orders   Lipid panel   Encounter for Papanicolaou smear for cervical cancer screening       Relevant Orders   Cytology - PAP   Hyperglycemia       Relevant Orders   Hemoglobin A1c   Screen for STD (sexually transmitted disease)       Relevant Orders   Cervicovaginal ancillary only( [place])       Follow up: Return in about 1 year (around 04/10/2022) for CPE (fasting).  Alysia Penna, NP

## 2021-04-11 LAB — COMPREHENSIVE METABOLIC PANEL
ALT: 11 U/L (ref 0–35)
AST: 18 U/L (ref 0–37)
Albumin: 4.1 g/dL (ref 3.5–5.2)
Alkaline Phosphatase: 64 U/L (ref 39–117)
BUN: 10 mg/dL (ref 6–23)
CO2: 26 mEq/L (ref 19–32)
Calcium: 9.4 mg/dL (ref 8.4–10.5)
Chloride: 103 mEq/L (ref 96–112)
Creatinine, Ser: 0.8 mg/dL (ref 0.40–1.20)
GFR: 92 mL/min (ref >60.00)
Glucose, Bld: 80 mg/dL (ref 70–99)
Potassium: 4.3 mEq/L (ref 3.5–5.1)
Sodium: 139 mEq/L (ref 135–145)
Total Bilirubin: 0.9 mg/dL (ref 0.2–1.2)
Total Protein: 7.6 g/dL (ref 6.0–8.3)

## 2021-04-11 LAB — CBC WITH DIFFERENTIAL/PLATELET
Basophils Absolute: 0 10*3/uL (ref 0.0–0.1)
Basophils Relative: 0.6 % (ref 0.0–3.0)
Eosinophils Absolute: 0.1 10*3/uL (ref 0.0–0.7)
Eosinophils Relative: 0.8 % (ref 0.0–5.0)
HCT: 37 % (ref 36.0–46.0)
Hemoglobin: 12 g/dL (ref 12.0–15.0)
Lymphocytes Relative: 20.1 % (ref 12.0–46.0)
Lymphs Abs: 1.6 10*3/uL (ref 0.7–4.0)
MCHC: 32.4 g/dL (ref 30.0–36.0)
MCV: 86.5 fl (ref 78.0–100.0)
Monocytes Absolute: 0.5 10*3/uL (ref 0.1–1.0)
Monocytes Relative: 5.9 % (ref 3.0–12.0)
Neutro Abs: 5.6 10*3/uL (ref 1.4–7.7)
Neutrophils Relative %: 72.6 % (ref 43.0–77.0)
Platelets: 369 10*3/uL (ref 150.0–400.0)
RBC: 4.28 Mil/uL (ref 3.87–5.11)
RDW: 13.7 % (ref 11.5–15.5)
WBC: 7.7 10*3/uL (ref 4.0–10.5)

## 2021-04-11 LAB — HEMOGLOBIN A1C: Hgb A1c MFr Bld: 5 % (ref 4.6–6.5)

## 2021-04-11 LAB — LIPID PANEL
Cholesterol: 156 mg/dL (ref 0–200)
HDL: 57.1 mg/dL (ref >39.00)
LDL Cholesterol: 89 mg/dL (ref 0–99)
NonHDL: 98.86
Total CHOL/HDL Ratio: 3
Triglycerides: 51 mg/dL (ref 0.0–149.0)
VLDL: 10.2 mg/dL (ref 0.0–40.0)

## 2021-04-11 LAB — TSH: TSH: 1.35 u[IU]/mL (ref 0.35–5.50)

## 2021-04-14 LAB — CERVICOVAGINAL ANCILLARY ONLY
Bacterial Vaginitis (gardnerella): POSITIVE — AB
Candida Glabrata: NEGATIVE
Candida Vaginitis: NEGATIVE
Chlamydia: NEGATIVE
Comment: NEGATIVE
Comment: NEGATIVE
Comment: NEGATIVE
Comment: NEGATIVE
Comment: NEGATIVE
Comment: NORMAL
Neisseria Gonorrhea: NEGATIVE
Trichomonas: NEGATIVE

## 2021-04-14 LAB — CYTOLOGY - PAP
Adequacy: ABSENT
Comment: NEGATIVE
Diagnosis: NEGATIVE
High risk HPV: NEGATIVE

## 2021-04-15 ENCOUNTER — Encounter: Payer: Self-pay | Admitting: Nurse Practitioner

## 2021-04-15 MED ORDER — METRONIDAZOLE 500 MG PO TABS: 500.0000 mg | ORAL_TABLET | Freq: Two times a day (BID) | ORAL | 0 refills | Status: DC

## 2021-04-15 NOTE — Addendum Note (Signed)
Addended by: Alysia Penna L on: 04/15/2021 01:03 PM   Modules accepted: Orders

## 2021-04-19 DIAGNOSIS — Z62820 Parent-biological child conflict: Secondary | ICD-10-CM | POA: Diagnosis not present

## 2021-04-19 DIAGNOSIS — F4321 Adjustment disorder with depressed mood: Secondary | ICD-10-CM | POA: Diagnosis not present

## 2021-04-26 DIAGNOSIS — F4321 Adjustment disorder with depressed mood: Secondary | ICD-10-CM | POA: Diagnosis not present

## 2021-04-26 DIAGNOSIS — Z62820 Parent-biological child conflict: Secondary | ICD-10-CM | POA: Diagnosis not present

## 2021-05-10 DIAGNOSIS — F4321 Adjustment disorder with depressed mood: Secondary | ICD-10-CM | POA: Diagnosis not present

## 2021-05-10 DIAGNOSIS — Z62820 Parent-biological child conflict: Secondary | ICD-10-CM | POA: Diagnosis not present

## 2021-06-07 DIAGNOSIS — Z62820 Parent-biological child conflict: Secondary | ICD-10-CM | POA: Diagnosis not present

## 2021-06-07 DIAGNOSIS — F4321 Adjustment disorder with depressed mood: Secondary | ICD-10-CM | POA: Diagnosis not present

## 2021-06-14 DIAGNOSIS — Z62820 Parent-biological child conflict: Secondary | ICD-10-CM | POA: Diagnosis not present

## 2021-06-14 DIAGNOSIS — F4321 Adjustment disorder with depressed mood: Secondary | ICD-10-CM | POA: Diagnosis not present

## 2021-06-21 DIAGNOSIS — Z62820 Parent-biological child conflict: Secondary | ICD-10-CM | POA: Diagnosis not present

## 2021-06-21 DIAGNOSIS — F4321 Adjustment disorder with depressed mood: Secondary | ICD-10-CM | POA: Diagnosis not present

## 2021-07-19 DIAGNOSIS — F4321 Adjustment disorder with depressed mood: Secondary | ICD-10-CM | POA: Diagnosis not present

## 2021-07-19 DIAGNOSIS — Z62820 Parent-biological child conflict: Secondary | ICD-10-CM | POA: Diagnosis not present

## 2021-07-26 DIAGNOSIS — F4321 Adjustment disorder with depressed mood: Secondary | ICD-10-CM | POA: Diagnosis not present

## 2021-07-26 DIAGNOSIS — Z62820 Parent-biological child conflict: Secondary | ICD-10-CM | POA: Diagnosis not present

## 2021-08-14 DIAGNOSIS — Z62820 Parent-biological child conflict: Secondary | ICD-10-CM | POA: Diagnosis not present

## 2021-08-14 DIAGNOSIS — F4321 Adjustment disorder with depressed mood: Secondary | ICD-10-CM | POA: Diagnosis not present

## 2021-08-21 DIAGNOSIS — Z62820 Parent-biological child conflict: Secondary | ICD-10-CM | POA: Diagnosis not present

## 2021-08-21 DIAGNOSIS — F4321 Adjustment disorder with depressed mood: Secondary | ICD-10-CM | POA: Diagnosis not present

## 2021-08-28 DIAGNOSIS — Z62820 Parent-biological child conflict: Secondary | ICD-10-CM | POA: Diagnosis not present

## 2021-08-28 DIAGNOSIS — F4321 Adjustment disorder with depressed mood: Secondary | ICD-10-CM | POA: Diagnosis not present

## 2021-09-04 DIAGNOSIS — F4321 Adjustment disorder with depressed mood: Secondary | ICD-10-CM | POA: Diagnosis not present

## 2021-09-04 DIAGNOSIS — Z62820 Parent-biological child conflict: Secondary | ICD-10-CM | POA: Diagnosis not present

## 2021-09-11 DIAGNOSIS — Z62820 Parent-biological child conflict: Secondary | ICD-10-CM | POA: Diagnosis not present

## 2021-09-11 DIAGNOSIS — F4321 Adjustment disorder with depressed mood: Secondary | ICD-10-CM | POA: Diagnosis not present

## 2021-09-24 DIAGNOSIS — F4321 Adjustment disorder with depressed mood: Secondary | ICD-10-CM | POA: Diagnosis not present

## 2021-09-24 DIAGNOSIS — Z62820 Parent-biological child conflict: Secondary | ICD-10-CM | POA: Diagnosis not present

## 2021-10-04 DIAGNOSIS — Z62820 Parent-biological child conflict: Secondary | ICD-10-CM | POA: Diagnosis not present

## 2021-10-04 DIAGNOSIS — F4321 Adjustment disorder with depressed mood: Secondary | ICD-10-CM | POA: Diagnosis not present

## 2021-10-11 DIAGNOSIS — Z62820 Parent-biological child conflict: Secondary | ICD-10-CM | POA: Diagnosis not present

## 2021-10-11 DIAGNOSIS — F4321 Adjustment disorder with depressed mood: Secondary | ICD-10-CM | POA: Diagnosis not present

## 2021-10-18 DIAGNOSIS — F4321 Adjustment disorder with depressed mood: Secondary | ICD-10-CM | POA: Diagnosis not present

## 2022-01-01 DIAGNOSIS — F331 Major depressive disorder, recurrent, moderate: Secondary | ICD-10-CM | POA: Diagnosis not present

## 2022-01-07 ENCOUNTER — Ambulatory Visit (INDEPENDENT_AMBULATORY_CARE_PROVIDER_SITE_OTHER): Payer: BLUE CROSS/BLUE SHIELD | Admitting: Family Medicine

## 2022-01-07 ENCOUNTER — Encounter: Payer: Self-pay | Admitting: Family Medicine

## 2022-01-07 ENCOUNTER — Other Ambulatory Visit (HOSPITAL_COMMUNITY)
Admission: RE | Admit: 2022-01-07 | Discharge: 2022-01-07 | Disposition: A | Discharge status: Home / Self Care | Payer: BLUE CROSS/BLUE SHIELD | Source: Ambulatory Visit | Attending: Family Medicine | Admitting: Family Medicine

## 2022-01-07 VITALS — BP 122/82 | HR 90 | Temp 98.4°F | Wt 197.0 lb

## 2022-01-07 DIAGNOSIS — L918 Other hypertrophic disorders of the skin: Secondary | ICD-10-CM

## 2022-01-07 DIAGNOSIS — Z113 Encounter for screening for infections with a predominantly sexual mode of transmission: Secondary | ICD-10-CM | POA: Insufficient documentation

## 2022-01-07 DIAGNOSIS — L309 Dermatitis, unspecified: Secondary | ICD-10-CM

## 2022-01-07 DIAGNOSIS — L0291 Cutaneous abscess, unspecified: Secondary | ICD-10-CM

## 2022-01-07 NOTE — Patient Instructions (Addendum)
Lab orders were placed for you to have labs drawn at [facility].

## 2022-01-07 NOTE — Progress Notes (Signed)
Subjective:    Patient ID: Daisy [name], female    DOB: [birth year], 41 y.o.   MRN: [id number]  Chief Complaint  Patient presents with   Mass    1 bump on vaginal area, partner woke up this morning with bumps on penis. Noticed it sometime last week, has been using witch hazel. Only hurts whens he touches it, but partner does not itch or hurt.     HPI Patient is a 41 yo female with pmh sig for adjustment d/o and iron def anemia who is followed by Alysia Penna, NP and seen today for acute concern.  Patient had a bump in genital area x 1 wk, not sure if still there.  Pt used witch hazel and sitz bath which helped.  Bump was not pruritic.  Area hurt when touched or during sex.  Pt denies vaginal d/c, burning, tingling, changes in soaps, lotions or detergents.  Pt states this am her partner had bumps on his penis, has picture on phone (several raised vesicular appearing lesions on dorsal surface of penis proximal to glans).  Pt also with a bump in posterior scalp at neck.  2 bumps on R upper lateral breast.  No past medical history on file.  Allergies  Allergen Reactions   Penicillins Swelling    Swollen lips    ROS General: Denies fever, chills, night sweats, changes in weight, changes in appetite HEENT: Denies headaches, ear pain, changes in vision, rhinorrhea, sore throat CV: Denies CP, palpitations, SOB, orthopnea Pulm: Denies SOB, cough, wheezing GI: Denies abdominal pain, nausea, vomiting, diarrhea, constipation GU: Denies dysuria, hematuria, frequency, vaginal discharge Msk: Denies muscle cramps, joint pains Neuro: Denies weakness, numbness, tingling Skin: Denies rashes, bruising +skin lesions Psych: Denies depression, anxiety, hallucinations     Objective:    Blood pressure 122/82, pulse 90, temperature 98.4 F (36.9 C), temperature source Oral, weight 197 lb (89.4 kg), SpO2 98 %.  Gen. Pleasant, well-nourished, in no distress, normal affect   HEENT: [place]/AT, face symmetric,  conjunctiva clear, no scleral icterus, PERRLA, EOMI, nares patent without drainage Lungs: no accessory muscle use Cardiovascular: RRR, no peripheral edema GU:  normal external female genitalia.  Normal urethral meatus, labia, introitus, perineum, anus.  Right medial buttock at gluteal cleft with what appears to be healing abscess without fluctuance skin has thin/stretch appearance in the area of reported lesion.  Aptima self swab obtained. Neuro:  A&Ox3, CN II-XII intact, normal gait Skin:  Warm, dry, intact.   Skin tag right upper lateral breast.  Medial to skin tag is a raised hyperpigmented lesion with central opening. Eczematous patch in posterior left scalp near hair line.  R posterior scalp with a 4 mm papule without erythema or drainage.   Wt Readings from Last 3 Encounters:  01/07/22 197 lb (89.4 kg)  04/10/21 189 lb (85.7 kg)  01/09/21 191 lb 3.2 oz (86.7 kg)    Lab Results  Component Value Date   WBC 7.7 04/10/2021   HGB 12.0 04/10/2021   HCT 37.0 04/10/2021   PLT 369.0 04/10/2021   GLUCOSE 80 04/10/2021   CHOL 156 04/10/2021   TRIG 51.0 04/10/2021   HDL 57.10 04/10/2021   LDLCALC 89 04/10/2021   ALT 11 04/10/2021   AST 18 04/10/2021   NA 139 04/10/2021   K 4.3 04/10/2021   CL 103 04/10/2021   CREATININE 0.80 04/10/2021   BUN 10 04/10/2021   CO2 26 04/10/2021   TSH 1.35 04/10/2021   HGBA1C 5.0 04/10/2021  Assessment/Plan:  Abscess -Genital lesion resolving.  No raised area present.  Appearance of skin similar to that of resolving abscess with stretched/thin appearance.  No drainage noted. -Continue supportive care including sitz bath's versus warm compress. -Discussed obtaining viral culture if any vesicular lesions appear.  Routine screening for STI (sexually transmitted infection) -Aptima swab obtained. -Clinic lab closed at time of patient's appointment.  Labs ordered.  Patient to have drawn at [place] this evening.  - Plan: Cervicovaginal  ancillary only, HIV Antibody (routine testing w rflx), RPR  Skin tag -Discussed removal options if desired  Eczema, unspecified type -Discussed using a good moisturizer, avoiding hot showers, products for sensitive skin, etc.  F/u as needed with PCP  Grier Mitts, MD

## 2022-01-08 ENCOUNTER — Other Ambulatory Visit: Payer: BLUE CROSS/BLUE SHIELD

## 2022-01-08 DIAGNOSIS — Z113 Encounter for screening for infections with a predominantly sexual mode of transmission: Secondary | ICD-10-CM

## 2022-01-09 LAB — RPR: RPR Ser Ql: NONREACTIVE

## 2022-01-09 LAB — CERVICOVAGINAL ANCILLARY ONLY
Bacterial Vaginitis (gardnerella): NEGATIVE
Candida Glabrata: NEGATIVE
Candida Vaginitis: NEGATIVE
Chlamydia: NEGATIVE
Comment: NEGATIVE
Comment: NEGATIVE
Comment: NEGATIVE
Comment: NEGATIVE
Comment: NEGATIVE
Comment: NORMAL
Neisseria Gonorrhea: NEGATIVE
Trichomonas: NEGATIVE

## 2022-01-09 LAB — HIV ANTIBODY (ROUTINE TESTING W REFLEX): HIV 1&2 Ab, 4th Generation: NONREACTIVE

## 2022-02-03 DIAGNOSIS — F331 Major depressive disorder, recurrent, moderate: Secondary | ICD-10-CM | POA: Diagnosis not present

## 2022-02-10 DIAGNOSIS — F331 Major depressive disorder, recurrent, moderate: Secondary | ICD-10-CM | POA: Diagnosis not present

## 2022-02-17 DIAGNOSIS — F331 Major depressive disorder, recurrent, moderate: Secondary | ICD-10-CM | POA: Diagnosis not present

## 2022-02-25 DIAGNOSIS — F331 Major depressive disorder, recurrent, moderate: Secondary | ICD-10-CM | POA: Diagnosis not present

## 2022-03-23 ENCOUNTER — Telehealth: Payer: Self-pay | Admitting: Nurse Practitioner

## 2022-03-23 ENCOUNTER — Encounter: Payer: Self-pay | Admitting: Family Medicine

## 2022-03-23 ENCOUNTER — Ambulatory Visit (INDEPENDENT_AMBULATORY_CARE_PROVIDER_SITE_OTHER): Payer: 59 | Admitting: Family Medicine

## 2022-03-23 VITALS — BP 120/72 | HR 89 | Temp 97.8°F | Ht 62.0 in | Wt 192.4 lb

## 2022-03-23 DIAGNOSIS — S161XXA Strain of muscle, fascia and tendon at neck level, initial encounter: Secondary | ICD-10-CM

## 2022-03-23 DIAGNOSIS — Z566 Other physical and mental strain related to work: Secondary | ICD-10-CM

## 2022-03-23 MED ORDER — CYCLOBENZAPRINE HCL 10 MG PO TABS: 10.0000 mg | ORAL_TABLET | Freq: Three times a day (TID) | ORAL | 0 refills | Status: DC | PRN

## 2022-03-23 MED ORDER — DICLOFENAC SODIUM 75 MG PO TBEC: 75.0000 mg | DELAYED_RELEASE_TABLET | Freq: Two times a day (BID) | ORAL | 0 refills | Status: DC

## 2022-03-23 MED ORDER — KETOROLAC TROMETHAMINE 60 MG/2ML IM SOLN
60.0000 mg | Freq: Once | INTRAMUSCULAR | Status: AC
  Administered 2022-03-23: 60 mg via INTRAMUSCULAR

## 2022-03-23 NOTE — Progress Notes (Signed)
Established Patient Office Visit   Subjective:  Patient ID: Daisy [name], female    DOB: [birth year]  Age: 42 y.o. MRN: [id number]  Chief Complaint  Patient presents with   Pain    C/O lower left side back pains that have radiated to left shoulder symptoms started yesterday. Dizzy, weak right ear hear some ringing little pain x 2 days.     HPI Encounter Diagnoses  Name Primary?   Strain of neck muscle, initial encounter Yes   Stress at work    Presents with a 1 to 2-day history of pain in her left lateral neck moving into the top of her posterior shoulder and down her arm.  There have been muscle spasms.  She denies nausea, diaphoresis or shortness of breath.  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  Her job is quite stressful.  She sometimes feels a fluttering in her ear.  She had an episode of lightheadedness yesterday while in the shower and had to sit down.   Review of Systems  Constitutional: Negative.   HENT: Negative.    Eyes:  Negative for blurred vision, discharge and redness.  Respiratory: Negative.    Cardiovascular: Negative.  Negative for palpitations.  Gastrointestinal:  Negative for abdominal pain.  Genitourinary: Negative.   Musculoskeletal:  Positive for myalgias and neck pain.  Skin:  Negative for rash.  Neurological:  Negative for dizziness, tingling, loss of consciousness, weakness and headaches.  Endo/Heme/Allergies:  Negative for polydipsia.     Current Outpatient Medications:    cyclobenzaprine (FLEXERIL) 10 MG tablet, Take 1 tablet (10 mg total) by mouth 3 (three) times daily as needed for muscle spasms., Disp: 30 tablet, Rfl: 0   diclofenac (VOLTAREN) 75 MG EC tablet, Take 1 tablet (75 mg total) by mouth 2 (two) times daily., Disp: 30 tablet, Rfl: 0   Ferrous Sulfate (IRON PO), Take by mouth., Disp: , Rfl:   Current Facility-Administered Medications:    ketorolac (TORADOL) injection 60 mg, 60 mg, Intramuscular, Once, Libby Maw, MD    Objective:     BP 120/72 (BP Location: Right Arm, Patient Position: Sitting, Cuff Size: Large)   Pulse 89   Temp 97.8 F (36.6 C) (Temporal)   Ht 5\' 2"  (1.575 m)   Wt 192 lb 6.4 oz (87.3 kg)   LMP  (LMP Unknown)   SpO2 98%   BMI 35.19 kg/m    Physical Exam Constitutional:      General: She is not in acute distress.    Appearance: Normal appearance. She is not ill-appearing, toxic-appearing or diaphoretic.  HENT:     Head: Normocephalic and atraumatic.     Right Ear: Tympanic membrane, ear canal and external ear normal.     Left Ear: Tympanic membrane, ear canal and external ear normal.     Mouth/Throat:     Mouth: Mucous membranes are moist.     Pharynx: Oropharynx is clear. No oropharyngeal exudate or posterior oropharyngeal erythema.  Eyes:     General: No scleral icterus.       Right eye: No discharge.        Left eye: No discharge.     Extraocular Movements: Extraocular movements intact.     Conjunctiva/sclera: Conjunctivae normal.     Pupils: Pupils are equal, round, and reactive to light.  Cardiovascular:     Rate and Rhythm: Normal rate and regular rhythm.  Pulmonary:     Effort: Pulmonary effort is normal. No respiratory distress.  Breath sounds: Normal breath sounds.  Abdominal:     General: Bowel sounds are normal.  Musculoskeletal:     Right shoulder: No tenderness. Normal range of motion. Normal strength.     Left shoulder: No tenderness. Normal range of motion. Normal strength.     Cervical back: Tenderness present. No erythema, signs of trauma, rigidity or bony tenderness. Pain with movement present. Decreased range of motion.  Skin:    General: Skin is warm and dry.  Neurological:     Mental Status: She is alert and oriented to person, place, and time.  Psychiatric:        Mood and Affect: Mood normal.        Behavior: Behavior normal.      No results found for any visits on 03/23/22.    The 10-year ASCVD risk score (Arnett DK, et al.,  2019) is: 0.3%    Assessment & Plan:   Strain of neck muscle, initial encounter -     Ketorolac Tromethamine -     Cyclobenzaprine HCl; Take 1 tablet (10 mg total) by mouth 3 (three) times daily as needed for muscle spasms.  Dispense: 30 tablet; Refill: 0 -     Diclofenac Sodium; Take 1 tablet (75 mg total) by mouth 2 (two) times daily.  Dispense: 30 tablet; Refill: 0  Stress at work    Return if symptoms worsen or fail to improve.  Out of work through Wednesday.  Start above medications.  Information was given on proper neck posterior and exercises for rehabilitation.  Libby Maw, MD

## 2022-03-23 NOTE — Telephone Encounter (Signed)
Daisy [name] needs a work note for her appointment today. Please send through my chart

## 2022-03-23 NOTE — Telephone Encounter (Signed)
Note for work sent in [employer]

## 2022-04-13 ENCOUNTER — Encounter: Payer: BLUE CROSS/BLUE SHIELD | Admitting: Nurse Practitioner

## 2022-05-28 ENCOUNTER — Encounter: Payer: Self-pay | Admitting: Nurse Practitioner

## 2022-05-28 ENCOUNTER — Ambulatory Visit (INDEPENDENT_AMBULATORY_CARE_PROVIDER_SITE_OTHER): Payer: Medicaid Other | Admitting: Nurse Practitioner

## 2022-05-28 VITALS — BP 122/80 | HR 81 | Temp 97.6°F | Resp 16 | Ht 62.0 in | Wt 196.6 lb

## 2022-05-28 DIAGNOSIS — Z803 Family history of malignant neoplasm of breast: Secondary | ICD-10-CM

## 2022-05-28 DIAGNOSIS — Z23 Encounter for immunization: Secondary | ICD-10-CM | POA: Diagnosis not present

## 2022-05-28 DIAGNOSIS — Z13220 Encounter for screening for lipoid disorders: Secondary | ICD-10-CM | POA: Diagnosis not present

## 2022-05-28 DIAGNOSIS — Z0000 Encounter for general adult medical examination without abnormal findings: Secondary | ICD-10-CM | POA: Diagnosis not present

## 2022-05-28 DIAGNOSIS — Z0001 Encounter for general adult medical examination with abnormal findings: Secondary | ICD-10-CM

## 2022-05-28 DIAGNOSIS — D500 Iron deficiency anemia secondary to blood loss (chronic): Secondary | ICD-10-CM | POA: Diagnosis not present

## 2022-05-28 DIAGNOSIS — Z136 Encounter for screening for cardiovascular disorders: Secondary | ICD-10-CM

## 2022-05-28 LAB — LIPID PANEL
Cholesterol: 140 mg/dL (ref 0–200)
HDL: 59.7 mg/dL (ref >39.00)
LDL Cholesterol: 72 mg/dL (ref 0–99)
NonHDL: 80.52
Total CHOL/HDL Ratio: 2
Triglycerides: 45 mg/dL (ref 0.0–149.0)
VLDL: 9 mg/dL (ref 0.0–40.0)

## 2022-05-28 LAB — CBC WITH DIFFERENTIAL/PLATELET
Basophils Absolute: 0.1 10*3/uL (ref 0.0–0.1)
Basophils Relative: 1.1 % (ref 0.0–3.0)
Eosinophils Absolute: 0.1 10*3/uL (ref 0.0–0.7)
Eosinophils Relative: 1.3 % (ref 0.0–5.0)
HCT: 31.7 % — ABNORMAL LOW (ref 36.0–46.0)
Hemoglobin: 10.2 g/dL — ABNORMAL LOW (ref 12.0–15.0)
Lymphocytes Relative: 26.5 % (ref 12.0–46.0)
Lymphs Abs: 1.9 10*3/uL (ref 0.7–4.0)
MCHC: 32.2 g/dL (ref 30.0–36.0)
MCV: 76.8 fl — ABNORMAL LOW (ref 78.0–100.0)
Monocytes Absolute: 0.6 10*3/uL (ref 0.1–1.0)
Monocytes Relative: 7.9 % (ref 3.0–12.0)
Neutro Abs: 4.5 10*3/uL (ref 1.4–7.7)
Neutrophils Relative %: 63.2 % (ref 43.0–77.0)
Platelets: 409 10*3/uL — ABNORMAL HIGH (ref 150.0–400.0)
RBC: 4.13 Mil/uL (ref 3.87–5.11)
RDW: 17.3 % — ABNORMAL HIGH (ref 11.5–15.5)
WBC: 7.1 10*3/uL (ref 4.0–10.5)

## 2022-05-28 LAB — IBC + FERRITIN
Ferritin: 3.8 ng/mL — ABNORMAL LOW (ref 10.0–291.0)
Iron: 17 ug/dL — ABNORMAL LOW (ref 42–145)
Saturation Ratios: 3.6 % — ABNORMAL LOW (ref 20.0–50.0)
TIBC: 477.4 ug/dL — ABNORMAL HIGH (ref 250.0–450.0)
Transferrin: 341 mg/dL (ref 212.0–360.0)

## 2022-05-28 LAB — COMPREHENSIVE METABOLIC PANEL
ALT: 10 U/L (ref 0–35)
AST: 12 U/L (ref 0–37)
Albumin: 4.1 g/dL (ref 3.5–5.2)
Alkaline Phosphatase: 68 U/L (ref 39–117)
BUN: 11 mg/dL (ref 6–23)
CO2: 27 mEq/L (ref 19–32)
Calcium: 9 mg/dL (ref 8.4–10.5)
Chloride: 105 mEq/L (ref 96–112)
Creatinine, Ser: 0.74 mg/dL (ref 0.40–1.20)
GFR: 100.23 mL/min (ref >60.00)
Glucose, Bld: 89 mg/dL (ref 70–99)
Potassium: 4 mEq/L (ref 3.5–5.1)
Sodium: 138 mEq/L (ref 135–145)
Total Bilirubin: 0.5 mg/dL (ref 0.2–1.2)
Total Protein: 7 g/dL (ref 6.0–8.3)

## 2022-05-28 NOTE — Patient Instructions (Signed)
Go to lab ? ?Preventive Care 21-42 Years Old, Female ?Preventive care refers to lifestyle choices and visits with your health care provider that can promote health and wellness. Preventive care visits are also called wellness exams. ?What can I expect for my preventive care visit? ?Counseling ?During your preventive care visit, your health care provider may ask about your: ?Medical history, including: ?Past medical problems. ?Family medical history. ?Pregnancy history. ?Current health, including: ?Menstrual cycle. ?Method of birth control. ?Emotional well-being. ?Home life and relationship well-being. ?Sexual activity and sexual health. ?Lifestyle, including: ?Alcohol, nicotine or tobacco, and drug use. ?Access to firearms. ?Diet, exercise, and sleep habits. ?Work and work environment. ?Sunscreen use. ?Safety issues such as seatbelt and bike helmet use. ?Physical exam ?Your health care provider may check your: ?Height and weight. These may be used to calculate your BMI (body mass index). BMI is a measurement that tells if you are at a healthy weight. ?Waist circumference. This measures the distance around your waistline. This measurement also tells if you are at a healthy weight and may help predict your risk of certain diseases, such as type 2 diabetes and high blood pressure. ?Heart rate and blood pressure. ?Body temperature. ?Skin for abnormal spots. ?What immunizations do I need? ? ?Vaccines are usually given at various ages, according to a schedule. Your health care provider will recommend vaccines for you based on your age, medical history, and lifestyle or other factors, such as travel or where you work. ?What tests do I need? ?Screening ?Your health care provider may recommend screening tests for certain conditions. This may include: ?Pelvic exam and Pap test. ?Lipid and cholesterol levels. ?Diabetes screening. This is done by checking your blood sugar (glucose) after you have not eaten for a while  (fasting). ?Hepatitis B test. ?Hepatitis C test. ?HIV (human immunodeficiency virus) test. ?STI (sexually transmitted infection) testing, if you are at risk. ?BRCA-related cancer screening. This may be done if you have a family history of breast, ovarian, tubal, or peritoneal cancers. ?Talk with your health care provider about your test results, treatment options, and if necessary, the need for more tests. ?Follow these instructions at home: ?Eating and drinking ? ?Eat a healthy diet that includes fresh fruits and vegetables, whole grains, lean protein, and low-fat dairy products. ?Take vitamin and mineral supplements as recommended by your health care provider. ?Do not drink alcohol if: ?Your health care provider tells you not to drink. ?You are pregnant, may be pregnant, or are planning to become pregnant. ?If you drink alcohol: ?Limit how much you have to 0-1 drink a day. ?Know how much alcohol is in your drink. In the [place] one drink equals one 12 oz bottle of beer (355 mL), one 5 oz glass of wine (148 mL), or one 1? oz glass of hard liquor (44 mL). ?Lifestyle ?Brush your teeth every morning and night with fluoride toothpaste. Floss one time each day. ?Exercise for at least 30 minutes 5 or more days each week. ?Do not use any products that contain nicotine or tobacco. These products include cigarettes, chewing tobacco, and vaping devices, such as e-cigarettes. If you need help quitting, ask your health care provider. ?Do not use drugs. ?If you are sexually active, practice safe sex. Use a condom or other form of protection to prevent STIs. ?If you do not wish to become pregnant, use a form of birth control. If you plan to become pregnant, see your health care provider for a prepregnancy visit. ?Find healthy ways to manage   stress, such as: ?Meditation, yoga, or listening to music. ?Journaling. ?Talking to a trusted person. ?Spending time with friends and family. ?Minimize exposure to UV radiation to reduce your  risk of skin cancer. ?Safety ?Always wear your seat belt while driving or riding in a vehicle. ?Do not drive: ?If you have been drinking alcohol. Do not ride with someone who has been drinking. ?If you have been using any mind-altering substances or drugs. ?While texting. ?When you are tired or distracted. ?Wear a helmet and other protective equipment during sports activities. ?If you have firearms in your house, make sure you follow all gun safety procedures. ?Seek help if you have been physically or sexually abused. ?What's next? ?Go to your health care provider once a year for an annual wellness visit. ?Ask your health care provider how often you should have your eyes and teeth checked. ?Stay up to date on all vaccines. ?This information is not intended to replace advice given to you by your health care provider. Make sure you discuss any questions you have with your health care provider. ?Document Revised: 08/14/2020 Document Reviewed: 08/14/2020 ?Elsevier Patient Education ? 2023 [employer] ? ?

## 2022-05-28 NOTE — Assessment & Plan Note (Addendum)
Declined mammogram despite Fhx of breast cancer (mother and maternal aunt) Declined referral for genetic testing

## 2022-05-28 NOTE — Progress Notes (Signed)
Complete physical exam  Patient: Daisy [name]   DOB: [birth year]   42 y.o. Female  MRN: [id number] Visit Date: 06/01/2022  Subjective:    Chief Complaint  Patient presents with   Annual Exam   Daisy [name] is a 42 y.o. female who presents today for a complete physical exam. She reports consuming a general diet.  No exercise regimen  She generally feels well. She reports sleeping well. She does not have additional problems to discuss today.  Vision:Yes Dental:No STD Screen:No  BP Readings from Last 3 Encounters:  05/28/22 122/80  03/23/22 120/72  01/07/22 122/82   Wt Readings from Last 3 Encounters:  05/28/22 196 lb 9.6 oz (89.2 kg)  03/23/22 192 lb 6.4 oz (87.3 kg)  01/07/22 197 lb (89.4 kg)   Most recent fall risk assessment:    05/28/2022   10:59 AM  Fall Risk   Falls in the past year? 0  Number falls in past yr: 0  Injury with Fall? 0  Risk for fall due to : No Fall Risks  Follow up Falls evaluation completed   Depression screen:Yes - Depression  Most recent depression screenings:    05/28/2022   10:59 AM 03/23/2022    3:06 PM  PHQ 2/9 Scores  PHQ - 2 Score 0 0   HPI  FHx: breast cancer in first degree relative Declined mammogram despite Fhx of breast cancer (mother and maternal aunt) Declined referral for genetic testing  Iron deficiency anemia secondary to blood loss (chronic) Repeat cbc and iron panel: Persistent iron deficient anemia due to menorrhagia. Need to start oral ferrous sulfate 1tab daily with food and vit. C supplement. Return to lab in 49month for repeat iron panel. Consider referral to hematology and GYN if no improvement  No past medical history on file. Past Surgical History:  Procedure Laterality Date   HERNIA REPAIR     Social History   Socioeconomic History   Marital status: Single    Spouse name: Not on file   Number of children: 1   Years of education: Not on file   Highest education level: Not on file  Occupational History    Not on file  Tobacco Use   Smoking status: Never   Smokeless tobacco: Never  Vaping Use   Vaping Use: Never used  Substance and Sexual Activity   Alcohol use: No   Drug use: Never   Sexual activity: Yes    Birth control/protection: Condom  Other Topics Concern   Not on file  Social History Narrative   Not on file   Social Determinants of Health   Financial Resource Strain: Not on file  Food Insecurity: Not on file  Transportation Needs: Not on file  Physical Activity: Not on file  Stress: Not on file  Social Connections: Not on file  Intimate Partner Violence: Not on file   Family Status  Relation Name Status   Mother  Alive   Father  Deceased   Brother  Alive   [place]  (Not Specified)   MGM  Alive   MGF  Deceased   Family History  Problem Relation Age of Onset   Hypertension Mother    Cancer Mother 55       breast   Diabetes Father    Cancer Maternal Aunt        beast cancer   Hypertension Maternal Grandmother    Diabetes Maternal Grandmother    Dementia Maternal Grandfather 42   Allergies  Allergen Reactions   Penicillins Swelling    Swollen lips    Patient Care Team: Robert Sunga, Charlene Brooke, NP as PCP - General (Internal Medicine)   Medications: Outpatient Medications Prior to Visit  Medication Sig   [DISCONTINUED] cyclobenzaprine (FLEXERIL) 10 MG tablet Take 1 tablet (10 mg total) by mouth 3 (three) times daily as needed for muscle spasms. (Patient not taking: Reported on 05/28/2022)   [DISCONTINUED] diclofenac (VOLTAREN) 75 MG EC tablet Take 1 tablet (75 mg total) by mouth 2 (two) times daily. (Patient not taking: Reported on 05/28/2022)   [DISCONTINUED] Ferrous Sulfate (IRON PO) Take by mouth. (Patient not taking: Reported on 05/28/2022)   No facility-administered medications prior to visit.    Review of Systems  Constitutional:  Negative for activity change, appetite change and unexpected weight change.  Respiratory: Negative.    Cardiovascular:  Negative.   Gastrointestinal: Negative.   Endocrine: Negative for cold intolerance and heat intolerance.  Genitourinary: Negative.   Musculoskeletal: Negative.   Skin: Negative.   Neurological: Negative.   Hematological: Negative.   Psychiatric/Behavioral:  Negative for behavioral problems, decreased concentration, dysphoric mood, hallucinations, self-injury, sleep disturbance and suicidal ideas. The patient is not nervous/anxious.         Objective:  BP 122/80 (BP Location: Left Arm, Patient Position: Sitting, Cuff Size: Normal)   Pulse 81   Temp 97.6 F (36.4 C) (Temporal)   Resp 16   Ht 5\' 2"  (1.575 m)   Wt 196 lb 9.6 oz (89.2 kg)   LMP 05/17/2022 (Approximate)   SpO2 99%   BMI 35.96 kg/m     Physical Exam Vitals and nursing note reviewed.  Constitutional:      General: She is not in acute distress. HENT:     Right Ear: Tympanic membrane, ear canal and external ear normal.     Left Ear: Tympanic membrane, ear canal and external ear normal.     Nose: Nose normal.  Eyes:     Extraocular Movements: Extraocular movements intact.     Conjunctiva/sclera: Conjunctivae normal.     Pupils: Pupils are equal, round, and reactive to light.  Neck:     Thyroid: No thyroid mass, thyromegaly or thyroid tenderness.  Cardiovascular:     Rate and Rhythm: Normal rate and regular rhythm.     Pulses: Normal pulses.     Heart sounds: Normal heart sounds.  Pulmonary:     Effort: Pulmonary effort is normal.     Breath sounds: Normal breath sounds.  Abdominal:     General: Bowel sounds are normal.     Palpations: Abdomen is soft.  Musculoskeletal:        General: Normal range of motion.     Cervical back: Normal range of motion and neck supple.     Right lower leg: No edema.     Left lower leg: No edema.  Lymphadenopathy:     Cervical: No cervical adenopathy.  Skin:    General: Skin is warm and dry.  Neurological:     Mental Status: She is alert and oriented to person, place,  and time.     Cranial Nerves: No cranial nerve deficit.  Psychiatric:        Mood and Affect: Mood normal.        Behavior: Behavior normal.        Thought Content: Thought content normal.      Results for orders placed or performed in visit on 05/28/22  Comprehensive metabolic panel  Result  Value Ref Range   Sodium 138 135 - 145 mEq/L   Potassium 4.0 3.5 - 5.1 mEq/L   Chloride 105 96 - 112 mEq/L   CO2 27 19 - 32 mEq/L   Glucose, Bld 89 70 - 99 mg/dL   BUN 11 6 - 23 mg/dL   Creatinine, Ser 0.74 0.40 - 1.20 mg/dL   Total Bilirubin 0.5 0.2 - 1.2 mg/dL   Alkaline Phosphatase 68 39 - 117 U/L   AST 12 0 - 37 U/L   ALT 10 0 - 35 U/L   Total Protein 7.0 6.0 - 8.3 g/dL   Albumin 4.1 3.5 - 5.2 g/dL   GFR 100.23 >60.00 mL/min   Calcium 9.0 8.4 - 10.5 mg/dL  Lipid panel  Result Value Ref Range   Cholesterol 140 0 - 200 mg/dL   Triglycerides 45.0 0.0 - 149.0 mg/dL   HDL 59.70 >39.00 mg/dL   VLDL 9.0 0.0 - 40.0 mg/dL   LDL Cholesterol 72 0 - 99 mg/dL   Total CHOL/HDL Ratio 2    NonHDL 80.52   CBC with Differential/Platelet  Result Value Ref Range   WBC 7.1 4.0 - 10.5 K/uL   RBC 4.13 3.87 - 5.11 Mil/uL   Hemoglobin 10.2 (L) 12.0 - 15.0 g/dL   HCT 31.7 (L) 36.0 - 46.0 %   MCV 76.8 (L) 78.0 - 100.0 fl   MCHC 32.2 30.0 - 36.0 g/dL   RDW 17.3 (H) 11.5 - 15.5 %   Platelets 409.0 (H) 150.0 - 400.0 K/uL   Neutrophils Relative % 63.2 43.0 - 77.0 %   Lymphocytes Relative 26.5 12.0 - 46.0 %   Monocytes Relative 7.9 3.0 - 12.0 %   Eosinophils Relative 1.3 0.0 - 5.0 %   Basophils Relative 1.1 0.0 - 3.0 %   Neutro Abs 4.5 1.4 - 7.7 K/uL   Lymphs Abs 1.9 0.7 - 4.0 K/uL   Monocytes Absolute 0.6 0.1 - 1.0 K/uL   Eosinophils Absolute 0.1 0.0 - 0.7 K/uL   Basophils Absolute 0.1 0.0 - 0.1 K/uL  IBC + Ferritin  Result Value Ref Range   Iron 17 (L) 42 - 145 ug/dL   Transferrin 341.0 212.0 - 360.0 mg/dL   Saturation Ratios 3.6 (L) 20.0 - 50.0 %   Ferritin 3.8 (L) 10.0 - 291.0 ng/mL   TIBC  477.4 (H) 250.0 - 450.0 mcg/dL      Assessment & Plan:    Routine Health Maintenance and Physical Exam  Immunization History  Administered Date(s) Administered   Tdap 05/28/2022    Health Maintenance  Topic Date Due   COVID-19 Vaccine (1) 06/13/2022 (Originally 08/08/1985)   INFLUENZA VACCINE  10/01/2022   PAP SMEAR-Modifier  04/10/2024   DTaP/Tdap/Td (2 - Td or Tdap) 05/27/2032   Hepatitis C Screening  Completed   HIV Screening  Completed   HPV VACCINES  Aged Out    Discussed health benefits of physical activity, and encouraged her to engage in regular exercise appropriate for her age and condition.  Problem List Items Addressed This Visit       Other   FHx: breast cancer in first degree relative    Declined mammogram despite Fhx of breast cancer (mother and maternal aunt) Declined referral for genetic testing      Iron deficiency anemia secondary to blood loss (chronic)    Repeat cbc and iron panel: Persistent iron deficient anemia due to menorrhagia. Need to start oral ferrous sulfate 1tab daily with food and vit.  C supplement. Return to lab in 44month for repeat iron panel. Consider referral to hematology and GYN if no improvement      Relevant Medications   Iron, Ferrous Sulfate, 325 (65 Fe) MG TABS   Other Relevant Orders   CBC with Differential/Platelet (Completed)   IBC + Ferritin (Completed)   Iron, TIBC and Ferritin Panel   Other Visit Diagnoses     Encounter for preventative adult health care exam with abnormal findings    -  Primary   Relevant Orders   Comprehensive metabolic panel (Completed)   Lipid panel (Completed)   Encounter for lipid screening for cardiovascular disease       Relevant Orders   Lipid panel (Completed)      Return in about 1 week (around 06/04/2022) for tinnitus and rash.     Wilfred Lacy, NP

## 2022-06-01 MED ORDER — IRON (FERROUS SULFATE) 325 (65 FE) MG PO TABS: 325.0000 mg | ORAL_TABLET | Freq: Every day | ORAL | 0 refills | Status: DC

## 2022-06-01 NOTE — Assessment & Plan Note (Addendum)
Repeat cbc and iron panel: Persistent iron deficient anemia due to menorrhagia. Need to start oral ferrous sulfate 1tab daily with food and vit. C supplement. Return to lab in 76month for repeat iron panel. Consider referral to hematology and GYN if no improvement

## 2022-06-01 NOTE — Progress Notes (Signed)
Abnormal: Normal CMP and lipid panel Persistent iron deficient anemia due to menorrhagia. Need to start oral ferrous sulfate 1tab daily with food and vit. C supplement. Return to lab in 74month for repeat iron panel.

## 2022-06-04 ENCOUNTER — Ambulatory Visit (INDEPENDENT_AMBULATORY_CARE_PROVIDER_SITE_OTHER): Payer: 59 | Admitting: Nurse Practitioner

## 2022-06-04 ENCOUNTER — Encounter: Payer: Self-pay | Admitting: Nurse Practitioner

## 2022-06-04 VITALS — BP 108/82 | HR 80 | Temp 98.5°F | Resp 16 | Ht 62.0 in | Wt 193.6 lb

## 2022-06-04 DIAGNOSIS — R21 Rash and other nonspecific skin eruption: Secondary | ICD-10-CM | POA: Insufficient documentation

## 2022-06-04 DIAGNOSIS — H9313 Tinnitus, bilateral: Secondary | ICD-10-CM | POA: Diagnosis not present

## 2022-06-04 MED ORDER — CLOTRIMAZOLE-BETAMETHASONE 1-0.05 % EX CREA: 1.0000 | TOPICAL_CREAM | Freq: Two times a day (BID) | CUTANEOUS | 0 refills | Status: DC

## 2022-06-04 NOTE — Progress Notes (Signed)
Established Patient Visit  Patient: Daisy [name]   DOB: 26-Jun-1980   42 y.o. Female  MRN: [id number] Visit Date: 06/04/2022  Subjective:    Chief Complaint  Patient presents with   Tinnitus   Rash    The ringing in ears - comes and goes.  Worse at work when she wears her head set.  Rash is constant    Rash This is a chronic problem. The current episode started more than 1 year ago. The problem has been waxing and waning since onset. The affected locations include the neck. The rash is characterized by dryness, itchiness and scaling. She was exposed to nothing. Pertinent negatives include no anorexia, cough, diarrhea, fatigue, joint pain, nail changes, rhinorrhea, sore throat or vomiting. Past treatments include moisturizer and anti-itch cream. The treatment provided mild relief. There is no history of allergies, asthma, eczema or varicella.  Ear Fullness  There is pain in both ears. This is a new problem. The current episode started more than 1 month ago (79months ago). The problem has been waxing and waning. There has been no fever. Associated symptoms include hearing loss and a rash. Pertinent negatives include no abdominal pain, coughing, diarrhea, ear discharge, headaches, neck pain, rhinorrhea, sore throat or vomiting. Associated symptoms comments: Ringing in both ears with use of head phones. She has tried nothing for the symptoms. There is no history of a chronic ear infection, hearing loss or a tympanostomy tube.   Reviewed medical, surgical, and social history today  Medications: Outpatient Medications Prior to Visit  Medication Sig   Iron, Ferrous Sulfate, 325 (65 Fe) MG TABS Take 325 mg by mouth daily.   No facility-administered medications prior to visit.   Reviewed past medical and social history.   ROS per HPI above  Last CBC Lab Results  Component Value Date   WBC 7.1 05/28/2022   HGB 10.2 (L) 05/28/2022   HCT 31.7 (L) 05/28/2022   MCV 76.8 (L)  05/28/2022   RDW 17.3 (H) 05/28/2022   PLT 409.0 (H) 0000000   Last metabolic panel Lab Results  Component Value Date   GLUCOSE 89 05/28/2022   NA 138 05/28/2022   K 4.0 05/28/2022   CL 105 05/28/2022   CO2 27 05/28/2022   BUN 11 05/28/2022   CREATININE 0.74 05/28/2022   CALCIUM 9.0 05/28/2022   PROT 7.0 05/28/2022   ALBUMIN 4.1 05/28/2022   BILITOT 0.5 05/28/2022   ALKPHOS 68 05/28/2022   AST 12 05/28/2022   ALT 10 05/28/2022   Last lipids Lab Results  Component Value Date   CHOL 140 05/28/2022   HDL 59.70 05/28/2022   LDLCALC 72 05/28/2022   TRIG 45.0 05/28/2022   CHOLHDL 2 05/28/2022   Last hemoglobin A1c Lab Results  Component Value Date   HGBA1C 5.0 04/10/2021   Last thyroid functions Lab Results  Component Value Date   TSH 1.35 04/10/2021        Objective:  BP 108/82 (BP Location: Right Arm, Patient Position: Sitting, Cuff Size: Large)   Pulse 80   Temp 98.5 F (36.9 C) (Temporal)   Resp 16   Ht 5\' 2"  (1.575 m)   Wt 193 lb 9.6 oz (87.8 kg)   LMP 05/17/2022 (Approximate)   SpO2 100%   BMI 35.41 kg/m      Physical Exam Vitals reviewed.  HENT:     Right Ear: Hearing, tympanic membrane, ear  canal and external ear normal.     Left Ear: Hearing, tympanic membrane, ear canal and external ear normal.     Ears:     Weber exam findings: Does not lateralize.    Right Rinne: AC > BC.    Left Rinne: AC > BC. Cardiovascular:     Rate and Rhythm: Normal rate.     Pulses: Normal pulses.  Pulmonary:     Effort: Pulmonary effort is normal.  Musculoskeletal:     Cervical back: Normal range of motion and neck supple.  Lymphadenopathy:     Cervical: No cervical adenopathy.  Skin:    Findings: Rash present. No erythema. Rash is scaling.          Comments: Hyperpigmented patch on neck, close to hairline, lichenification noted  Neurological:     Mental Status: She is alert.     No results found for any visits on 06/04/22.    Assessment & Plan:     Problem List Items Addressed This Visit       Musculoskeletal and Integument   Rash - Primary   Relevant Medications   clotrimazole-betamethasone (LOTRISONE) cream     Other   Tinnitus aurium, bilateral   Relevant Orders   Ambulatory referral to Audiology   Return if symptoms worsen or fail to improve.     Wilfred Lacy, NP

## 2022-06-04 NOTE — Patient Instructions (Signed)
Call office if rash does not improve in 2weeks  Tinnitus Tinnitus refers to hearing a sound when there is no actual source for that sound. This is often described as ringing in the ears. However, people with this condition may hear a variety of noises, in one ear or in both ears. The sounds of tinnitus can be soft, loud, or somewhere in between. Tinnitus can last for a few seconds or can be constant for days. It may go away without treatment and come back at various times. When tinnitus is constant or happens often, it can lead to other problems, such as trouble sleeping and trouble concentrating. Almost everyone experiences tinnitus at some point. Tinnitus is not the same as hearing loss. Tinnitus that is long-lasting (chronic) or comes back often (recurs) may require medical attention. What are the causes? The cause of tinnitus is often not known. In some cases, it can result from: Exposure to loud noises from machinery, music, or other sources. An object (foreign body) stuck in the ear. Earwax buildup. Drinking alcohol or caffeine. Taking certain medicines. Age-related hearing loss. It may also be caused by medical conditions such as: Ear or sinus infections. Heart diseases or high blood pressure. Allergies. Mnire's disease. Thyroid problems. Tumors. A weak, bulging blood vessel (aneurysm) near the ear. What increases the risk? The following factors may make you more likely to develop this condition: Exposure to loud noises. Age. Tinnitus is more likely in older individuals. Using alcohol or tobacco. What are the signs or symptoms? The main symptom of tinnitus is hearing a sound when there is no source for that sound. It may sound like: Buzzing. Sizzling. Ringing. Blowing air. Hissing. Whistling. Other sounds may include: Roaring. Running water. A musical note. Tapping. Humming. Symptoms may affect only one ear (unilateral) or both ears (bilateral). How is this  diagnosed? Tinnitus is diagnosed based on your symptoms, your medical history, and a physical exam. Your health care provider may do a thorough hearing test (audiologic exam) if your tinnitus: Is unilateral. Causes hearing difficulties. Lasts 6 months or longer. You may work with a health care provider who specializes in hearing disorders (audiologist). You may be asked questions about your symptoms and how they affect your daily life. You may have other tests done, such as: CT scan. MRI. An imaging test of how blood flows through your blood vessels (angiogram). How is this treated? Treating an underlying medical condition can sometimes make tinnitus go away. If your tinnitus continues, other treatments may include: Therapy and counseling to help you manage the stress of living with tinnitus. Sound generators to mask the tinnitus. These include: Tabletop sound machines that play relaxing sounds to help you fall asleep. Wearable devices that fit in your ear and play sounds or music. Acoustic neural stimulation. This involves using headphones to listen to music that contains an auditory signal. Over time, listening to this signal may change some pathways in your brain and make you less sensitive to tinnitus. This treatment is used for very severe cases when no other treatment is working. Using hearing aids or cochlear implants if your tinnitus is related to hearing loss. Hearing aids are worn in the outer ear. Cochlear implants are surgically placed in the inner ear. Follow these instructions at home: Managing symptoms     When possible, avoid being in loud places and being exposed to loud sounds. Wear hearing protection, such as earplugs, when you are exposed to loud noises. Use a white noise machine, a  humidifier, or other devices to mask the sound of tinnitus. Practice techniques for reducing stress, such as meditation, yoga, or deep breathing. Work with your health care provider if you  need help with managing stress. Sleep with your head slightly raised. This may reduce the impact of tinnitus. General instructions Do not use stimulants, such as nicotine, alcohol, or caffeine. Talk with your health care provider about other stimulants to avoid. Stimulants are substances that can make you feel alert and attentive by increasing certain activities in the body (such as heart rate and blood pressure). These substances may make tinnitus worse. Take over-the-counter and prescription medicines only as told by your health care provider. Try to get plenty of sleep each night. Keep all follow-up visits. This is important. Contact a health care provider if: Your tinnitus continues for 3 weeks or longer without stopping. You develop sudden hearing loss. Your symptoms get worse or do not get better with home care. You feel you are not able to manage the stress of living with tinnitus. Get help right away if: You develop tinnitus after a head injury. You have tinnitus along with any of the following: Dizziness. Nausea and vomiting. Loss of balance. Sudden, severe headache. Vision changes. Facial weakness or weakness of arms or legs. These symptoms may represent a serious problem that is an emergency. Do not wait to see if the symptoms will go away. Get medical help right away. Call your local emergency services (911 in the [place] Do not drive yourself to the hospital. Summary Tinnitus refers to hearing a sound when there is no actual source for that sound. This is often described as ringing in the ears. Symptoms may affect only one ear (unilateral) or both ears (bilateral). Use a white noise machine, a humidifier, or other devices to mask the sound of tinnitus. Do not use stimulants, such as nicotine, alcohol, or caffeine. These substances may make tinnitus worse. This information is not intended to replace advice given to you by your health care provider. Make sure you discuss any  questions you have with your health care provider. Document Revised: 01/22/2020 Document Reviewed: 01/22/2020 Elsevier Patient Education  [employer].

## 2022-06-05 ENCOUNTER — Telehealth: Payer: Self-pay | Admitting: Nurse Practitioner

## 2022-06-05 NOTE — Telephone Encounter (Signed)
Referral moved internally to  [facility] [address] [place],  [place]  [address] Get Driving Directions Main: [phone] Ltr sent to patient

## 2022-06-05 NOTE — Telephone Encounter (Signed)
[facility]  is calling in stating they are unable to schedule the patient an appointment because his insurance is out of network.

## 2022-06-15 ENCOUNTER — Ambulatory Visit: Payer: 59 | Attending: Audiologist | Admitting: Audiologist

## 2022-06-15 DIAGNOSIS — H9193 Unspecified hearing loss, bilateral: Secondary | ICD-10-CM

## 2022-06-15 DIAGNOSIS — H833X3 Noise effects on inner ear, bilateral: Secondary | ICD-10-CM

## 2022-06-15 NOTE — Procedures (Signed)
Outpatient Audiology and [facility] [address] [place], [place]  [address] [phone]  AUDIOLOGICAL  EVALUATION  NAME: Daisy [name]     DOB:   November 18, 1980      MRN: [id number]                                                                                     DATE: 06/15/2022     REFERENT: Anne Ng, NP STATUS: Outpatient DIAGNOSIS: Decreased Hearing Bilaterally, Noise Effects on the Ear    History: Navdeep was seen for an audiological evaluation.  Kristalynn is receiving a hearing evaluation due to concerns for ear pain, ringing in the ears, ear soreness, and headaches . Lakeishia has difficulty hearing over her work headset. She has to turn up the volume to understand people who are not clear. These symptoms have gradually gotten worse. Trichia has a history of noise exposure from wearing her work headset for ten or more hours a day.  Medical history negative for a condition which is a risk factor for hearing loss. No other relevant case history reported.   Evaluation:  Otoscopy showed a clear view of the tympanic membranes, bilaterally Tympanometry results were consistent with normal middle ear function. Blyss had pain with tympanometry.  Audiometric testing was completed using conventional audiometry with supraural transducer. Speech Recognition Thresholds were 30dB in the right ear and 25dB in the left ear. Word Recognition was performed 40dB SL, scored 100% in the right ear and 100% in the left ear. 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 This is consistent with trauma due to headset use seen in the studies cited below.   Quick Speech in Noise Test (QuickSIN):  list of six sentences with five key words per sentence is presented in four-talker babble noise. The sentences are presented at pre- recorded signal-to-noise ratios which decrease in 5-dB steps from 25 (very easy) to 0 (extremely difficult). The SNRs  used are: 25, 20, 15, 10, 5 and 0, encompassing normal to severely impaired performance in noise. Taxes binaural separation and discrimination skills. Jeanann performed 3dB SNR loss for both ears. Charlie has normal ability to hear speech in noise.   Results:  The test results were reviewed with [facility]. Candus has decreased low frequency hearing, pain in the ears, tinnitus, and headaches. Her symptoms are consistent with excessive exposure to noise due to prolonged headset use (see citation below). Her symptoms are most likely the result of auditory trauma form wearing her headset 10 hours a day for work. She needs to reduce headset use and take a break every sixty minutes. Do not turn volume above 60%. Use speaker phone when possible.   Recommendations: Symptoms consistent with trauma of auditory system due to prolonged headset use. Reduce use of headphones to sixty minute periods within a day, do not raise volume above 60%. Use speaker phone.  Recommend referral to Otolaryngology to rule out any medical cause of pain and tinnitus.   Citations:  Grant Ruts., et al. "The effects of noise on telephone operators." Journal of Occupational Medicine 21.1 [phone]): 21-25. Olagbemi, D.O.O., Asoegwu, C.N., Somefun, A.O.  et al. Otologic symptoms and hearing thresholds among a cohort of call center operators in [place]. [place] J Otolaryngol 38, 133 (2022).   35 minutes spent testing and counseling on results.   Ammie Ferrier  Audiologist, Au.D., CCC-A 06/15/2022  3:28 PM  Cc: Anne Ng, NP

## 2022-07-21 ENCOUNTER — Encounter: Payer: Self-pay | Admitting: Nurse Practitioner

## 2022-07-21 ENCOUNTER — Ambulatory Visit (INDEPENDENT_AMBULATORY_CARE_PROVIDER_SITE_OTHER): Payer: 59 | Admitting: Nurse Practitioner

## 2022-07-21 VITALS — BP 110/80 | HR 90 | Temp 98.6°F | Resp 16 | Ht 62.0 in | Wt 193.2 lb

## 2022-07-21 DIAGNOSIS — S46211A Strain of muscle, fascia and tendon of other parts of biceps, right arm, initial encounter: Secondary | ICD-10-CM

## 2022-07-21 MED ORDER — IBUPROFEN 600 MG PO TABS: 600.0000 mg | ORAL_TABLET | Freq: Three times a day (TID) | ORAL | 0 refills | Status: DC | PRN

## 2022-07-21 NOTE — Patient Instructions (Signed)
Muscle Strain A muscle strain, or pulled muscle, happens when a muscle is stretched beyond its normal length. This can tear some muscle fibers and cause pain. Usually, it takes 1-2 weeks to heal from a muscle strain. Full healing normally takes 5-6 weeks. What are the causes? This condition is caused when a sudden force is placed on a muscle and stretches it too far. This can happen with a fall, while lifting, or during sports. What increases the risk? You are more likely to develop a muscle strain if you are an athlete or you do a lot of physical activity. What are the signs or symptoms? Pain. Tenderness. Bruising. Swelling. Trouble using the muscle. How is this treated? This condition is first treated with PRICE therapy. This involves: Protecting your muscle from being injured again. Resting your injured muscle. Icing your injured muscle. Putting pressure (compression) on your injured muscle. This may be done with a splint or elastic bandage. Raising (elevating) your injured muscle. Your doctor may also recommend medicine for pain. Follow these instructions at home: If you have a splint that can be taken off: Wear the splint as told by your doctor. Take it off only as told by your doctor. Check the skin around the splint every day. Tell your doctor if you see problems. Loosen the splint if your fingers or toes: Tingle. Become numb. Turn cold and blue. Keep the splint clean. If the splint is not waterproof: Do not let it get wet. Cover it with a watertight covering when you take a bath or a shower. Managing pain, stiffness, and swelling  If told, put ice on your injured area. To do this: If you have a removable splint, take it off as told by your doctor. Put ice in a plastic bag. Place a towel between your skin and the bag. Leave the ice on for 20 minutes, 2-3 times a day. Take off the ice if your skin turns bright red. This is very important. If you cannot feel pain, heat,  or cold, you have a greater risk of damage to the area. Move your fingers or toes often. Raise the injured area above the level of your heart while you are sitting or lying down. Wear an elastic bandage as told by your doctor. Make sure it is not too tight. General instructions Take over-the-counter and prescription medicines only as told by your doctor. This may include: Medicines for pain and swelling that are taken by mouth or put on the skin. Medicines to help relax your muscles. Limit your activity. Rest your injured muscle as told by your doctor. Your doctor may say that gentle movements are okay. If physical therapy was prescribed, do exercises as told by your doctor. Do not put pressure on any part of the splint until it is fully hardened. This may take many hours. Do not smoke or use any products that contain nicotine or tobacco. If you need help quitting, ask your doctor. Ask your doctor when it is safe to drive if you have a splint. Keep all follow-up visits. How is this prevented? Warm up before you exercise. This helps to prevent more muscle strains. Contact a doctor if: You have more pain or swelling in the injured area. Get help right away if: You have any of these problems in your injured area: Numbness. Tingling. Less strength than normal. Summary A muscle strain is an injury that happens when a muscle is stretched beyond normal length. This condition is first treated with PRICE   therapy. This includes protecting, resting, icing, adding pressure, and raising your injury. Limit your activity. Rest your injured muscle as told by your doctor. Your doctor may say that gentle movements are okay. Warm up before you exercise. This helps to prevent more muscle strains. This information is not intended to replace advice given to you by your health care provider. Make sure you discuss any questions you have with your health care provider. Document Revised: 05/06/2020 Document  Reviewed: 05/06/2020 Elsevier Patient Education  2023 Elsevier Inc.  

## 2022-07-21 NOTE — Progress Notes (Signed)
Acute Office Visit  Subjective:    Patient ID: Daisy [name], female    DOB: [birth year], 42 y.o.   MRN: [id number]  Chief Complaint  Patient presents with   Arm Pain    Right arm pain.  She was lifting her daughter up and lifter her wrong.  She pain in the right deltoid area and hurts to move or lift arm.  This happened Sunday and pain is 7/10.  She just used ice packs for the pain.   Arm Pain  The incident occurred 12 to 24 hours ago. The injury mechanism was twisted. The pain is present in the upper right arm. The quality of the pain is described as aching and cramping. The pain radiates to the right arm. The pain has been Constant since the incident. Pertinent negatives include no chest pain, muscle weakness, numbness or tingling. The symptoms are aggravated by movement and palpation. She has tried heat, ice and immobilization for the symptoms. The treatment provided mild relief.  Onset after rough playing with teenage daughter on Sunday. She swung right arm forward and felt tingling pain radiate from upper arm to hand.  Outpatient Medications Prior to Visit  Medication Sig   clotrimazole-betamethasone (LOTRISONE) cream Apply 1 Application topically 2 (two) times daily.   Iron, Ferrous Sulfate, 325 (65 Fe) MG TABS Take 325 mg by mouth daily.   No facility-administered medications prior to visit.   Reviewed past medical and social history.  Review of Systems  Cardiovascular:  Negative for chest pain.  Neurological:  Negative for tingling and numbness.      Objective:    Physical Exam Cardiovascular:     Rate and Rhythm: Normal rate.     Pulses: Normal pulses.  Pulmonary:     Effort: Pulmonary effort is normal.  Musculoskeletal:     Right shoulder: Normal.     Right upper arm: Tenderness present. No swelling, edema, deformity, lacerations or bony tenderness.     Right elbow: Normal.     Right forearm: Normal.     Right wrist: Normal.     Right hand: Normal.     Cervical  back: Normal range of motion and neck supple. No rigidity.     Comments: Guarded ROM of right upper arm but normal. No weakness noted, no swelling for deformity noted  Lymphadenopathy:     Cervical: No cervical adenopathy.    BP 110/80 (BP Location: Left Arm, Patient Position: Sitting, Cuff Size: Large)   Pulse 90   Temp 98.6 F (37 C) (Temporal)   Resp 16   Ht 5\' 2"  (1.575 m)   Wt 193 lb 3.2 oz (87.6 kg)   LMP 07/05/2022 (Exact Date)   SpO2 96%   BMI 35.34 kg/m    No results found for any visits on 07/21/22.     Assessment & Plan:   Problem List Items Addressed This Visit   None Visit Diagnoses     Muscle strain    -  Primary   Relevant Medications   ibuprofen (ADVIL) 600 MG tablet      Meds ordered this encounter  Medications   ibuprofen (ADVIL) 600 MG tablet    Sig: Take 1 tablet (600 mg total) by mouth every 8 (eight) hours as needed. With food    Dispense:  30 tablet    Refill:  0    Order Specific Question:   Supervising Provider    Answer:   Nadene Rubins ALFRED [[id number]]  Advised to  use ibuprofen as prescribed and cold compress prn.  Return if symptoms worsen or fail to improve.  Alysia Penna, NP

## 2022-12-26 ENCOUNTER — Ambulatory Visit
Admission: RE | Admit: 2022-12-26 | Discharge: 2022-12-26 | Disposition: A | Discharge status: Home / Self Care | Payer: 59 | Source: Ambulatory Visit | Attending: Nurse Practitioner | Admitting: Nurse Practitioner

## 2022-12-26 ENCOUNTER — Other Ambulatory Visit: Payer: Self-pay | Admitting: Nurse Practitioner

## 2022-12-26 DIAGNOSIS — Z1231 Encounter for screening mammogram for malignant neoplasm of breast: Secondary | ICD-10-CM

## 2023-03-02 ENCOUNTER — Telehealth: Payer: Self-pay | Admitting: Nurse Practitioner

## 2023-03-02 NOTE — Telephone Encounter (Signed)
 Copied from CRM [id number]. Topic: General - Call Back - No Documentation >> Mar 02, 2023 11:09 AM Samuel Jester B wrote: Reason for CRM: Pt is requesting a callback regarding if she can come in and get a TB testing done for her employer.

## 2023-03-02 NOTE — Telephone Encounter (Signed)
If pt can get this done, I can schedule it.

## 2023-03-04 NOTE — Telephone Encounter (Signed)
 Lvmtcb and sent mychart to schedule her a nurse visit.

## 2023-05-10 ENCOUNTER — Other Ambulatory Visit: Payer: Self-pay | Admitting: Nurse Practitioner

## 2023-05-10 DIAGNOSIS — R21 Rash and other nonspecific skin eruption: Secondary | ICD-10-CM

## 2023-05-11 ENCOUNTER — Other Ambulatory Visit (HOSPITAL_COMMUNITY): Payer: Self-pay

## 2023-05-11 MED ORDER — CLOTRIMAZOLE-BETAMETHASONE 1-0.05 % EX CREA
1.0000 | TOPICAL_CREAM | Freq: Two times a day (BID) | CUTANEOUS | 0 refills | Status: DC
  Filled 2023-05-11: qty 30, 15d supply, fill #0

## 2023-05-11 NOTE — Telephone Encounter (Signed)
 Requesting: clotrimazole-betamethasone (LOTRISONE) cream  Last Visit: 07/21/2022 Next Visit: 06/01/2023 Last Refill: 06/04/2022  Please Advise

## 2023-05-12 ENCOUNTER — Other Ambulatory Visit: Payer: Self-pay

## 2023-05-12 ENCOUNTER — Other Ambulatory Visit (HOSPITAL_COMMUNITY): Payer: Self-pay

## 2023-05-12 ENCOUNTER — Encounter: Payer: Self-pay | Admitting: Pharmacist

## 2023-05-13 ENCOUNTER — Other Ambulatory Visit: Payer: Self-pay

## 2023-05-31 ENCOUNTER — Encounter: Payer: Medicaid Other | Admitting: Nurse Practitioner

## 2023-06-01 ENCOUNTER — Telehealth: Payer: Self-pay

## 2023-06-01 ENCOUNTER — Encounter: Payer: Self-pay | Admitting: Nurse Practitioner

## 2023-06-01 ENCOUNTER — Ambulatory Visit (INDEPENDENT_AMBULATORY_CARE_PROVIDER_SITE_OTHER): Payer: Medicaid Other | Admitting: Nurse Practitioner

## 2023-06-01 VITALS — BP 120/80 | HR 94 | Temp 98.0°F | Ht 62.5 in | Wt 196.0 lb

## 2023-06-01 DIAGNOSIS — R002 Palpitations: Secondary | ICD-10-CM | POA: Diagnosis not present

## 2023-06-01 DIAGNOSIS — F411 Generalized anxiety disorder: Secondary | ICD-10-CM

## 2023-06-01 DIAGNOSIS — D500 Iron deficiency anemia secondary to blood loss (chronic): Secondary | ICD-10-CM

## 2023-06-01 LAB — BASIC METABOLIC PANEL WITH GFR
BUN: 11 mg/dL (ref 6–23)
CO2: 24 meq/L (ref 19–32)
Calcium: 9 mg/dL (ref 8.4–10.5)
Chloride: 104 meq/L (ref 96–112)
Creatinine, Ser: 0.75 mg/dL (ref 0.40–1.20)
GFR: 97.93 mL/min (ref >60.00)
Glucose, Bld: 74 mg/dL (ref 70–99)
Potassium: 4 meq/L (ref 3.5–5.1)
Sodium: 136 meq/L (ref 135–145)

## 2023-06-01 LAB — CBC
HCT: 39.4 % (ref 36.0–46.0)
Hemoglobin: 12.8 g/dL (ref 12.0–15.0)
MCHC: 32.5 g/dL (ref 30.0–36.0)
MCV: 89.8 fl (ref 78.0–100.0)
Platelets: 322 10*3/uL (ref 150.0–400.0)
RBC: 4.38 Mil/uL (ref 3.87–5.11)
RDW: 14.8 % (ref 11.5–15.5)
WBC: 6.3 10*3/uL (ref 4.0–10.5)

## 2023-06-01 LAB — TSH: TSH: 1.63 u[IU]/mL (ref 0.35–5.50)

## 2023-06-01 MED ORDER — HYDROXYZINE HCL 10 MG PO TABS: 10.0000 mg | ORAL_TABLET | Freq: Three times a day (TID) | ORAL | 0 refills | Status: DC | PRN

## 2023-06-01 NOTE — Assessment & Plan Note (Addendum)
 Current use of liquid iron supplement daily. Reports difficulty swallowing tablets Reports palpitation and SOB with exertion.  Repeat CBC, THYROID, BMP, and iron panel today Consider referral to hematology if no improvement

## 2023-06-01 NOTE — Telephone Encounter (Signed)
 Work note typed and sent to patient via MyChart. Patient was called and informed that work note was sent to her via MyChart. She thanked me for calling.

## 2023-06-01 NOTE — Patient Instructions (Signed)
 Go to lab Schedule appointment with therapist when contacted Stop caffeine Use vistaril as needed  Managing Anxiety, Adult After being diagnosed with anxiety, you may be relieved to know why you have felt or behaved a certain way. You may also feel overwhelmed about the treatment ahead and what it will mean for your life. With care and support, you can manage your anxiety. How to manage lifestyle changes Understanding the difference between stress and anxiety Although stress can play a role in anxiety, it is not the same as anxiety. Stress is your body's reaction to life changes and events, both good and bad. Stress is often caused by something external, such as a deadline, test, or competition. It normally goes away after the event has ended and will last just a few hours. But, stress can be ongoing and can lead to more than just stress. Anxiety is caused by something internal, such as imagining a terrible outcome or worrying that something will go wrong that will greatly upset you. Anxiety often does not go away even after the event is over, and it can become a long-term (chronic) worry. Lowering stress and anxiety Talk with your health care provider or a counselor to learn more about lowering anxiety and stress. They may suggest tension-reduction techniques, such as: Music. Spend time creating or listening to music that you enjoy and that inspires you. Mindfulness-based meditation. Practice being aware of your normal breaths while not trying to control your breathing. It can be done while sitting or walking. Centering prayer. Focus on a word, phrase, or sacred image that means something to you and brings you peace. Deep breathing. Expand your stomach and inhale slowly through your nose. Hold your breath for 3-5 seconds. Then breathe out slowly, letting your stomach muscles relax. Self-talk. Learn to notice and spot thought patterns that lead to anxiety reactions. Change those patterns to thoughts  that feel peaceful. Muscle relaxation. Take time to tense muscles and then relax them. Choose a tension-reduction technique that fits your lifestyle and personality. These techniques take time and practice. Set aside 5-15 minutes a day to do them. Specialized therapists can offer counseling and training in these techniques. The training to help with anxiety may be covered by some insurance plans. Other things you can do to manage stress and anxiety include: Keeping a stress diary. This can help you learn what triggers your reaction and then learn ways to manage your response. Thinking about how you react to certain situations. You may not be able to control everything, but you can control your response. Making time for activities that help you relax and not feeling guilty about spending your time in this way. Doing visual imagery. This involves imagining or creating mental pictures to help you relax. Practicing yoga. Through yoga poses, you can lower tension and relax.  Medicines Medicines for anxiety include: Antidepressant medicines. These are usually prescribed for long-term daily control. Anti-anxiety medicines. These may be added in severe cases, especially when panic attacks occur. When used together, medicines, psychotherapy, and tension-reduction techniques may be the most effective treatment. Relationships Relationships can play a big part in helping you recover. Spend more time connecting with trusted friends and family members. Think about going to couples counseling if you have a partner, taking family education classes, or going to family therapy. Therapy can help you and others better understand your anxiety. How to recognize changes in your anxiety Everyone responds differently to treatment for anxiety. Recovery from anxiety happens when symptoms lessen  and stop interfering with your daily life at home or work. This may mean that you will start to: Have better concentration and  focus. Worry will interfere less in your daily thinking. Sleep better. Be less irritable. Have more energy. Have improved memory. Try to recognize when your condition is getting worse. Contact your provider if your symptoms interfere with home or work and you feel like your condition is not improving. Follow these instructions at home: Activity Exercise. Adults should: Exercise for at least 150 minutes each week. The exercise should increase your heart rate and make you sweat (moderate-intensity exercise). Do strengthening exercises at least twice a week. Get the right amount and quality of sleep. Most adults need 7-9 hours of sleep each night. Lifestyle  Eat a healthy diet that includes plenty of vegetables, fruits, whole grains, low-fat dairy products, and lean protein. Do not eat a lot of foods that are high in fats, added sugars, or salt (sodium). Make choices that simplify your life. Do not use any products that contain nicotine or tobacco. These products include cigarettes, chewing tobacco, and vaping devices, such as e-cigarettes. If you need help quitting, ask your provider. Avoid caffeine, alcohol, and certain over-the-counter cold medicines. These may make you feel worse. Ask your pharmacist which medicines to avoid. General instructions Take over-the-counter and prescription medicines only as told by your provider. Keep all follow-up visits. This is to make sure you are managing your anxiety well or if you need more support. Where to find support You can get help and support from: Self-help groups. Online and [employer]. A trusted spiritual leader. Couples counseling. Family education classes. Family therapy. Where to find more information You may find that joining a support group helps you deal with your anxiety. The following sources can help you find counselors or support groups near you: Mental Health [place]: mentalhealthamerica.net Anxiety and Depression  Association of [place] (ADAA): adaa.org [employer] Mental Illness (NAMI): nami.org Contact a health care provider if: You have a hard time staying focused or finishing tasks. You spend many hours a day feeling worried about everyday life. You are very tired because you cannot stop worrying. You start to have headaches or often feel tense. You have chronic nausea or diarrhea. Get help right away if: Your heart feels like it is racing. You have shortness of breath. You have thoughts of hurting yourself or others. Get help right away if you feel like you may hurt yourself or others, or have thoughts about taking your own life. Go to your nearest emergency room or: Call 911. Call the National Suicide Prevention Lifeline at 715-525-1338 or 988. This is open 24 hours a day. Text the Crisis Text Line at (616) 774-3195. This information is not intended to replace advice given to you by your health care provider. Make sure you discuss any questions you have with your health care provider. Document Revised: 11/25/2021 Document Reviewed: 06/09/2020 Elsevier Patient Education  2024 [employer].

## 2023-06-01 NOTE — Progress Notes (Signed)
 Established Patient Visit  Patient: Daisy [name]   DOB: [birth year]   43 y.o. Female  MRN: [id number] Visit Date: 06/01/2023  Subjective:    Chief Complaint  Patient presents with   Headache    Starts out on right side may radiate to right side of face and to left side of head, sharp pain Right eye Friday morning was slanted/droopy warm/hot compress helped, itching bumps right cheek   Shortness of Breath    When upset and/or stressed    Palpitations    Intermittent for 1-2 months    Dizziness    Becomes dizzy when changing positions and/or standing to fast    Headache  This is a new problem. The current episode started 1 to 4 weeks ago. The problem occurs intermittently. The problem has been unchanged. The pain is located in the Parietal and temporal region. The pain does not radiate. The pain quality is not similar to prior headaches. The quality of the pain is described as aching. Associated symptoms include dizziness. Pertinent negatives include no abnormal behavior, back pain, coughing, drainage, ear pain, fever, hearing loss, muscle aches, nausea, numbness, phonophobia, rhinorrhea, sinus pressure, tinnitus, vomiting or weakness. The symptoms are aggravated by emotional stress. She has tried nothing for the symptoms. Her past medical history is significant for obesity. There is no history of cancer, cluster headaches, hypertension, immunosuppression, migraine headaches, migraines in the family, pseudotumor cerebri, recent head traumas, sinus disease or TMJ.  Shortness of Breath This is a chronic problem. The current episode started more than 1 month ago (7months ago). The problem occurs intermittently. The problem has been waxing and waning. Pertinent negatives include no chest pain, ear pain, fever, leg pain, rhinorrhea, sputum production, syncope, vomiting or wheezing. The symptoms are aggravated by emotional upset. The patient has no known risk factors for DVT/PE. She has  tried nothing for the symptoms. There is no history of allergies, aspirin allergies, asthma, bronchiolitis, CAD, chronic lung disease, COPD, DVT, a heart failure, PE, pneumonia or a recent surgery.  Palpitations  This is a chronic problem. The current episode started more than 1 month ago (7months ago). The problem has been waxing and waning. The symptoms are aggravated by stress and caffeine. Associated symptoms include anxiety, dizziness and shortness of breath. Pertinent negatives include no chest fullness, chest pain, coughing, diaphoresis, fever, irregular heartbeat, malaise/fatigue, nausea, near-syncope, numbness, syncope, vomiting or weakness. She has tried nothing for the symptoms. Risk factors include obesity, stress and sedentary lifestyle. Her past medical history is significant for anemia and anxiety. There is no history of drug use, heart disease, hyperthyroidism or a valve disorder.   GAD (generalized anxiety disorder) Chronic, waxing and waning, stressor at work and at home. Associated with palpitation, dizziness and SOB No SI/HI or hallucination No hx of IVC No illicit drug use or ALCOHOL or tobacco use Drinks caffeine daily.  She agreed to CBT referral and use of vistaril 10mg  prn Advised to stop use of caffeine F/up in 2weeks   Iron deficiency anemia secondary to blood loss (chronic) Current use of liquid iron supplement daily Reports palpitation and SOB with exertion.  Repeat CBC, THYROID, BMP, and iron panel today Consider referral to hematology if no improvement  BP Readings from Last 3 Encounters:  06/01/23 120/80  07/21/22 110/80  06/04/22 108/82    Wt Readings from Last 3 Encounters:  06/01/23 196 lb (88.9 kg)  07/21/22  193 lb 3.2 oz (87.6 kg)  06/04/22 193 lb 9.6 oz (87.8 kg)    Reviewed medical, surgical, and social history today  Medications: Outpatient Medications Prior to Visit  Medication Sig   clotrimazole-betamethasone (LOTRISONE) cream Apply 1  Application topically 2 (two) times daily.   IRON CR PO Take 125 mLs by mouth daily.   [DISCONTINUED] ibuprofen (ADVIL) 600 MG tablet Take 1 tablet (600 mg total) by mouth every 8 (eight) hours as needed. With food (Patient not taking: Reported on 06/01/2023)   [DISCONTINUED] Iron, Ferrous Sulfate, 325 (65 Fe) MG TABS Take 325 mg by mouth daily.   No facility-administered medications prior to visit.   Reviewed past medical and social history.   ROS per HPI above  Last CBC Lab Results  Component Value Date   WBC 7.1 05/28/2022   HGB 10.2 (L) 05/28/2022   HCT 31.7 (L) 05/28/2022   MCV 76.8 (L) 05/28/2022   RDW 17.3 (H) 05/28/2022   PLT 409.0 (H) 05/28/2022   Last metabolic panel Lab Results  Component Value Date   GLUCOSE 89 05/28/2022   NA 138 05/28/2022   K 4.0 05/28/2022   CL 105 05/28/2022   CO2 27 05/28/2022   BUN 11 05/28/2022   CREATININE 0.74 05/28/2022   GFR 100.23 05/28/2022   CALCIUM 9.0 05/28/2022   PROT 7.0 05/28/2022   ALBUMIN 4.1 05/28/2022   BILITOT 0.5 05/28/2022   ALKPHOS 68 05/28/2022   AST 12 05/28/2022   ALT 10 05/28/2022        Objective:  BP 120/80 (BP Location: Left Arm, Patient Position: Sitting, Cuff Size: Normal)   Pulse 94   Temp 98 F (36.7 C) (Temporal)   Ht 5' 2.5" (1.588 m)   Wt 196 lb (88.9 kg)   LMP 05/30/2023   SpO2 99%   BMI 35.28 kg/m   ECG: NSR, no previous ECG to compare    Physical Exam Vitals and nursing note reviewed.  Eyes:     Extraocular Movements: Extraocular movements intact.     Pupils: Pupils are equal, round, and reactive to light.  Cardiovascular:     Rate and Rhythm: Normal rate and regular rhythm.     Pulses: Normal pulses.     Heart sounds: Normal heart sounds. No murmur heard.    No friction rub. No gallop.  Pulmonary:     Effort: Pulmonary effort is normal.     Breath sounds: Normal breath sounds.  Musculoskeletal:     Cervical back: Normal range of motion and neck supple.     Right lower leg:  No edema.     Left lower leg: No edema.  Neurological:     Mental Status: She is alert and oriented to person, place, and time.     Cranial Nerves: No cranial nerve deficit.     Motor: No weakness.     Coordination: Romberg sign negative.     Deep Tendon Reflexes: Reflexes normal.  Psychiatric:        Attention and Perception: Attention normal.        Mood and Affect: Mood is anxious. Affect is tearful.        Speech: Speech normal.        Behavior: Behavior is cooperative.        Thought Content: Thought content normal.        Cognition and Memory: Cognition and memory normal.     No results found for any visits on 06/01/23.  Assessment & Plan:    Problem List Items Addressed This Visit     GAD (generalized anxiety disorder) - Primary   Chronic, waxing and waning, stressor at work and at home. Associated with palpitation, dizziness and SOB No SI/HI or hallucination No hx of IVC No illicit drug use or ALCOHOL or tobacco use Drinks caffeine daily.  She agreed to CBT referral and use of vistaril 10mg  prn Advised to stop use of caffeine F/up in 2weeks       Relevant Medications   hydrOXYzine (ATARAX) 10 MG tablet   Other Relevant Orders   Ambulatory referral to Psychology   Iron deficiency anemia secondary to blood loss (chronic)   Current use of liquid iron supplement daily Reports palpitation and SOB with exertion.  Repeat CBC, THYROID, BMP, and iron panel today Consider referral to hematology if no improvement      Relevant Medications   IRON CR PO   Other Relevant Orders   CBC   Iron, TIBC and Ferritin Panel   Basic metabolic panel with GFR   Other Visit Diagnoses       Palpitations       Relevant Orders   EKG 12-Lead (Completed)   Basic metabolic panel with GFR   TSH      Return in about 2 weeks (around 06/15/2023) for depression and anxiety, palpitation.     Alysia Penna, NP

## 2023-06-01 NOTE — Telephone Encounter (Signed)
 Copied from CRM [id number]. Topic: General - Other >> Jun 01, 2023  3:46 PM Rodman Pickle T wrote: Reason for CRM: patient Is needing a dr note for work she has been waiting since earlier to get this note she needs it today

## 2023-06-01 NOTE — Telephone Encounter (Signed)
 Forwarding message below

## 2023-06-01 NOTE — Assessment & Plan Note (Signed)
 Chronic, waxing and waning, stressor at work and at home. Associated with palpitation, dizziness and SOB No SI/HI or hallucination No hx of IVC No illicit drug use or ALCOHOL or tobacco use Drinks caffeine daily.  She agreed to CBT referral and use of vistaril 10mg  prn Advised to stop use of caffeine F/up in 2weeks

## 2023-06-02 ENCOUNTER — Encounter: Payer: Self-pay | Admitting: Nurse Practitioner

## 2023-06-02 LAB — IRON,TIBC AND FERRITIN PANEL
%SAT: 16 % (ref 16–45)
Ferritin: 18 ng/mL (ref 16–232)
Iron: 57 ug/dL (ref 40–190)
TIBC: 347 ug/dL (ref 250–450)

## 2023-06-15 ENCOUNTER — Ambulatory Visit (INDEPENDENT_AMBULATORY_CARE_PROVIDER_SITE_OTHER): Admitting: Nurse Practitioner

## 2023-06-15 ENCOUNTER — Encounter: Payer: Self-pay | Admitting: Nurse Practitioner

## 2023-06-15 ENCOUNTER — Encounter (INDEPENDENT_AMBULATORY_CARE_PROVIDER_SITE_OTHER): Payer: Self-pay

## 2023-06-15 VITALS — BP 108/82 | HR 74 | Temp 98.0°F | Ht 62.0 in | Wt 197.6 lb

## 2023-06-15 DIAGNOSIS — R519 Headache, unspecified: Secondary | ICD-10-CM | POA: Diagnosis not present

## 2023-06-15 DIAGNOSIS — E66812 Obesity, class 2: Secondary | ICD-10-CM

## 2023-06-15 DIAGNOSIS — F411 Generalized anxiety disorder: Secondary | ICD-10-CM

## 2023-06-15 DIAGNOSIS — E6609 Other obesity due to excess calories: Secondary | ICD-10-CM | POA: Diagnosis not present

## 2023-06-15 DIAGNOSIS — Z6836 Body mass index (BMI) 36.0-36.9, adult: Secondary | ICD-10-CM

## 2023-06-15 NOTE — Patient Instructions (Addendum)
 Use melatonin 5mg  or magnesium glycinate 400mg  at bedtime as needed for sleep. Avoid skipping meals. Maintain 64oz of water daily. Maintain daily exercise and heart healthy diet  DASH Eating Plan DASH stands for Dietary Approaches to Stop Hypertension. The DASH eating plan is a healthy eating plan that has been shown to: Lower high blood pressure (hypertension). Reduce your risk for type 2 diabetes, heart disease, and stroke. Help with weight loss. What are tips for following this plan? Reading food labels Check food labels for the amount of salt (sodium) per serving. Choose foods with less than 5 percent of the Daily Value (DV) of sodium. In [name], foods with less than 300 milligrams (mg) of sodium per serving fit into this eating plan. To find whole grains, look for the word "whole" as the first word in the ingredient list. Shopping Buy products labeled as "low-sodium" or "no salt added." Buy fresh foods. Avoid canned foods and pre-made or frozen meals. Cooking Try not to add salt when you cook. Use salt-free seasonings or herbs instead of table salt or sea salt. Check with your health care provider or pharmacist before using salt substitutes. Do not fry foods. Cook foods in healthy ways, such as baking, boiling, grilling, roasting, or broiling. Cook using oils that are good for your heart. These include olive, canola, avocado, soybean, and sunflower oil. Meal planning  Eat a balanced diet. This should include: 4 or more servings of fruits and 4 or more servings of vegetables each day. Try to fill half of your plate with fruits and vegetables. 6-8 servings of whole grains each day. 6 or less servings of lean meat, poultry, or fish each day. 1 oz is 1 serving. A 3 oz (85 g) serving of meat is about the same size as the palm of your hand. One egg is 1 oz (28 g). 2-3 servings of low-fat dairy each day. One serving is 1 cup (237 mL). 1 serving of nuts, seeds, or beans 5 times each  week. 2-3 servings of heart-healthy fats. Healthy fats called omega-3 fatty acids are found in foods such as walnuts, flaxseeds, fortified milks, and eggs. These fats are also found in cold-water fish, such as sardines, salmon, and mackerel. Limit how much you eat of: Canned or prepackaged foods. Food that is high in trans fat, such as fried foods. Food that is high in saturated fat, such as fatty meat. Desserts and other sweets, sugary drinks, and other foods with added sugar. Full-fat dairy products. Do not salt foods before eating. Do not eat more than 4 egg yolks a week. Try to eat at least 2 vegetarian meals a week. Eat more home-cooked food and less restaurant, buffet, and fast food. Lifestyle When eating at a restaurant, ask if your food can be made with less salt or no salt. If you drink alcohol: Limit how much you have to: 0-1 drink a day if you are female. 0-2 drinks a day if you are female. Know how much alcohol is in your drink. In the [place] one drink is one 12 oz bottle of beer (355 mL), one 5 oz glass of wine (148 mL), or one 1 oz glass of hard liquor (44 mL). [name] information Avoid eating more than 2,300 mg of salt a day. If you have hypertension, you may need to reduce your sodium intake to 1,500 mg a day. Work with your provider to stay at a healthy body weight or lose weight. Ask what the best weight range  is for you. On most days of the week, get at least 30 minutes of exercise that causes your heart to beat faster. This may include walking, swimming, or biking. Work with your provider or dietitian to adjust your eating plan to meet your specific calorie needs. What foods should I eat? Fruits All fresh, dried, or frozen fruit. Canned fruits that are in their natural juice and do not have sugar added to them. Vegetables Fresh or frozen vegetables that are raw, steamed, roasted, or grilled. Low-sodium or reduced-sodium tomato and vegetable juice. Low-sodium or  reduced-sodium tomato sauce and tomato paste. Low-sodium or reduced-sodium canned vegetables. Grains Whole-grain or whole-wheat bread. Whole-grain or whole-wheat pasta. Brown rice. Daisy [name]. Bulgur. Whole-grain and low-sodium cereals. Pita bread. Low-fat, low-sodium crackers. Whole-wheat flour tortillas. Meats and other proteins Skinless chicken or [place]. Ground chicken or [place]. Pork with fat trimmed off. Fish and seafood. Egg whites. Dried beans, peas, or lentils. Unsalted nuts, nut butters, and seeds. Unsalted canned beans. Lean cuts of beef with fat trimmed off. Low-sodium, lean precooked or cured meat, such as sausages or meat loaves. Dairy Low-fat (1%) or fat-free (skim) milk. Reduced-fat, low-fat, or fat-free cheeses. Nonfat, low-sodium ricotta or cottage cheese. Low-fat or nonfat yogurt. Low-fat, low-sodium cheese. Fats and oils Soft margarine without trans fats. Vegetable oil. Reduced-fat, low-fat, or light mayonnaise and salad dressings (reduced-sodium). Canola, safflower, olive, avocado, soybean, and sunflower oils. Avocado. Seasonings and condiments Herbs. Spices. Seasoning mixes without salt. Other foods Unsalted popcorn and pretzels. Fat-free sweets. The items listed above may not be all the foods and drinks you can have. Talk to a dietitian to learn more. What foods should I avoid? Fruits Canned fruit in a light or heavy syrup. Fried fruit. Fruit in cream or butter sauce. Vegetables Creamed or fried vegetables. Vegetables in a cheese sauce. Regular canned vegetables that are not marked as low-sodium or reduced-sodium. Regular canned tomato sauce and paste that are not marked as low-sodium or reduced-sodium. Regular tomato and vegetable juices that are not marked as low-sodium or reduced-sodium. Daisy [name]. Olives. Grains Baked goods made with fat, such as croissants, muffins, or some breads. Dry pasta or rice meal packs. Meats and other proteins Fatty cuts of meat. Ribs.  Fried meat. Daisy [name]. Bologna, salami, and other precooked or cured meats, such as sausages or meat loaves, that are not lean and low in sodium. Fat from the back of a pig (fatback). Bratwurst. Salted nuts and seeds. Canned beans with added salt. Canned or smoked fish. Whole eggs or egg yolks. Chicken or [place] with skin. Dairy Whole or 2% milk, cream, and half-and-half. Whole or full-fat cream cheese. Whole-fat or sweetened yogurt. Full-fat cheese. Nondairy creamers. Whipped toppings. Processed cheese and cheese spreads. Fats and oils Butter. Stick margarine. Lard. Shortening. Ghee. Bacon fat. Tropical oils, such as coconut, palm kernel, or palm oil. Seasonings and condiments Onion salt, garlic salt, seasoned salt, table salt, and sea salt. Worcestershire sauce. Tartar sauce. Barbecue sauce. Teriyaki sauce. Soy sauce, including reduced-sodium soy sauce. Steak sauce. Canned and packaged gravies. Fish sauce. Oyster sauce. Cocktail sauce. Store-bought horseradish. Ketchup. Mustard. Meat flavorings and tenderizers. Bouillon cubes. Hot sauces. Pre-made or packaged marinades. Pre-made or packaged taco seasonings. Relishes. Regular salad dressings. Other foods Salted popcorn and pretzels. The items listed above may not be all the foods and drinks you should avoid. Talk to a dietitian to learn more. Where to find more information National Heart, Lung, and Blood Institute (NHLBI): BuffaloDryCleaner.gl American Heart Association (AHA): heart.org  Academy of Nutrition and Dietetics: eatright.org National Kidney Foundation (NKF): kidney.org This information is not intended to replace advice given to you by your health care provider. Make sure you discuss any questions you have with your health care provider. Document Revised: 03/05/2022 Document Reviewed: 03/05/2022 Elsevier Patient Education  2024 Elsevier Inc.  How to Increase Your Level of Physical Activity Getting regular physical activity is important for your  overall health and well-being. Most people do not get enough exercise. There are easy ways to increase your level of physical activity, even if you have not been very active in the past or if you are just starting out. What are the benefits of physical activity? Physical activity has many short-term and long-term benefits. Being active on a regular basis can improve your physical and mental health as well as provide other benefits. Physical health benefits Helping you lose weight or maintain a healthy weight. Strengthening your muscles and bones. Reducing your risk of certain long-term (chronic) diseases, including heart disease, cancer, and diabetes. Being able to move around more easily and for longer periods of time without getting tired (increased endurance or stamina). Improving your ability to fight off illness (enhanced immunity). Being able to sleep better. Helping you stay healthy as you get older, including: Helping you stay mobile, or capable of walking and moving around. Preventing accidents, such as falls. Increasing life expectancy. Mental health benefits Boosting your mood and improving your self-esteem. Lowering your chance of having mental health problems, such as depression or anxiety. Helping you feel good about your body. Other benefits Finding new sources of fun and enjoyment. Meeting new people who share a common interest. Before you begin If you have a chronic illness or have not been active for a while, check with your health care provider about how to get started. Ask your health care provider what activities are safe for you. Start out slowly. Walking or doing some simple chair exercises is a good place to start, especially if you have not been active before or for a long time. Set goals that you can work toward. Ask your health care provider how much exercise is best for you. In [name], most adults should: Do moderate-intensity exercise for at least 150 minutes  each week (30 minutes on most days of the week) or vigorous exercise for at least 75 minutes each week, or a combination of these. Moderate-intensity exercise can include walking at a quick pace, biking, yoga, water aerobics, or gardening. Vigorous exercise involves activities that take more effort, such as jogging or running, playing sports, swimming laps, or jumping rope. Do strength exercises on at least 2 days each week. This can include weight lifting, body weight exercises, and resistance-band exercises. How to be more physically active Make a plan  Try to find activities that you enjoy. You are more likely to commit to an exercise routine if it does not feel like a chore. If you have bone or joint problems, choose low-impact exercises, like walking or swimming. Use these tips for being successful with an exercise plan: Find a workout partner for accountability. Join a group or class, such as an aerobics class, cycling class, or sports team. Make family time active. Go for a walk, bike, or swim. Include a variety of exercises each week. Consider using a fitness tracker, such as a mobile phone app or a device worn like a watch, that will count the number of steps you take each day. Many people strive to reach 10,000  steps a day. Find ways to be active in your daily routines Besides your formal exercise plans, you can find ways to do physical activity during your daily routines, such as: Walking or biking to work or to the store. Taking the stairs instead of the elevator. Parking farther away from the door at work or at the store. Planning walking meetings. Walking around while you are on the phone. Where to find more information Centers for Disease Control and Prevention: CampusCasting.com.pt President's Council on Fitness, Sports & Nutrition: www.fitness.gov ChooseMyPlate: http://www.harvey.com/ Contact a health care provider if: You have headaches, muscle aches, or joint pain that  is concerning. You feel dizzy or light-headed while exercising. You faint. You feel your heart skipping, racing, or fluttering. You have chest pain while exercising. Summary Exercise benefits your mind and body at any age, even if you are just starting out. If you have a chronic illness or have not been active for a while, check with your health care provider before increasing your physical activity. Choose activities that are safe and enjoyable for you. Ask your health care provider what activities are safe for you. Start slowly. Tell your health care provider if you have problems as you start to increase your activity level. This information is not intended to replace advice given to you by your health care provider. Make sure you discuss any questions you have with your health care provider. Document Revised: 06/14/2020 Document Reviewed: 06/14/2020 Elsevier Patient Education  2024 [employer].

## 2023-06-15 NOTE — Assessment & Plan Note (Addendum)
 Increased anxiety about her health. Presents with multiple physical symptoms- headache, myalgia, insomnia, lack of motivation, fatigue. She opted not to take vistaril  as prescribed-stated she does not want any medication. She has not scheduled appointment with therapist. Intermittent headache (right temple) with light headedness, fatigue, symptoms occur midday.Last eye  exam 01/2023, use of corrective lens. Works from home on a computer. Symptoms improve with food consumption. She admits to skipping meals-  breakfast and lunch. Her first meal is after working hour- after 3pm. She states she is skipping meals in an attempt to loose weight. Also complaints of insomnia-difficulty falling asleep. Start she does not want any prescription medication She is concerned of possible lupus, heart disease and aneurysm due to her Fhx. We discussed signs of possible autoimmune disorder, MI and potential aneurysm rupture. We also discussed risk factors of heart disease and aneurysm. I advised about importance of lifestyle modifications which will help minimize her risk for heart disease or stroke. I provided printed information Advised to schedule appointment with counselor. Advised to Use melatonin 5mg  or magnesium glycinate 400mg  at bedtime as needed for sleep. Avoid skipping meals. Maintain 64oz of water daily. Maintain daily exercise and heart healthy diet

## 2023-06-15 NOTE — Progress Notes (Signed)
 Established Patient Visit  Patient: Daisy [name]   DOB: 1981/01/31   43 y.o. Female  MRN: [id number] Visit Date: 06/20/2023  Subjective:    Chief Complaint  Patient presents with   Anxiety    2 week follow up GAD. Pt states she has not taken the Hydroxyzine  that was prescribed.    HPI GAD (generalized anxiety disorder) Increased anxiety about her health. Presents with multiple physical symptoms- headache, myalgia, insomnia, lack of motivation, fatigue. She opted not to take vistaril  as prescribed-stated she does not want any medication. She has not scheduled appointment with therapist. Intermittent headache (right temple) with light headedness, fatigue, symptoms occur midday.Last eye  exam 01/2023, use of corrective lens. Works from home on a computer. Symptoms improve with food consumption. She admits to skipping meals-  breakfast and lunch. Her first meal is after working hour- after 3pm. She states she is skipping meals in an attempt to loose weight. Also complaints of insomnia-difficulty falling asleep. Start she does not want any prescription medication She is concerned of possible lupus, heart disease and aneurysm due to her Fhx. We discussed signs of possible autoimmune disorder, MI and potential aneurysm rupture. We also discussed risk factors of heart disease and aneurysm. I advised about importance of lifestyle modifications which will help minimize her risk for heart disease or stroke. I provided printed information Advised to schedule appointment with counselor. Advised to Use melatonin 5mg  or magnesium glycinate 400mg  at bedtime as needed for sleep. Avoid skipping meals. Maintain 64oz of water daily. Maintain daily exercise and heart healthy diet  Reviewed medical, surgical, and social history today  Medications: Outpatient Medications Prior to Visit  Medication Sig Note   clotrimazole -betamethasone  (LOTRISONE ) cream Apply 1 Application topically 2 (two) times  daily.    IRON  CR PO Take 125 mLs by mouth daily.    hydrOXYzine  (ATARAX ) 10 MG tablet Take 1 tablet (10 mg total) by mouth 3 (three) times daily as needed. (Patient not taking: Reported on 06/15/2023) 06/15/2023: Pt states she have not started medication   No facility-administered medications prior to visit.   Reviewed past medical and social history.   Review of Systems  Constitutional:  Positive for malaise/fatigue.  HENT: Negative.    Eyes: Negative.   Respiratory: Negative.    Cardiovascular: Negative.   Gastrointestinal: Negative.   Musculoskeletal:  Positive for myalgias.  Skin: Negative.   Neurological: Negative.   Psychiatric/Behavioral:  Negative for depression, hallucinations, memory loss, substance abuse and suicidal ideas. The patient is nervous/anxious and has insomnia.     Last CBC Lab Results  Component Value Date   WBC 6.3 06/01/2023   HGB 12.8 06/01/2023   HCT 39.4 06/01/2023   MCV 89.8 06/01/2023   RDW 14.8 06/01/2023   PLT 322.0 06/01/2023   Last metabolic panel Lab Results  Component Value Date   GLUCOSE 74 06/01/2023   NA 136 06/01/2023   K 4.0 06/01/2023   CL 104 06/01/2023   CO2 24 06/01/2023   BUN 11 06/01/2023   CREATININE 0.75 06/01/2023   GFR 97.93 06/01/2023   CALCIUM 9.0 06/01/2023   PROT 7.0 05/28/2022   ALBUMIN 4.1 05/28/2022   BILITOT 0.5 05/28/2022   ALKPHOS 68 05/28/2022   AST 12 05/28/2022   ALT 10 05/28/2022   Last hemoglobin A1c Lab Results  Component Value Date   HGBA1C 5.0 04/10/2021   Last thyroid  functions Lab Results  Component Value Date   TSH 1.63 06/01/2023      Objective:  BP 108/82 (BP Location: Right Arm, Patient Position: Sitting, Cuff Size: Normal)   Pulse 74   Temp 98 F (36.7 C) (Temporal)   Ht 5\' 2"  (1.575 m)   Wt 197 lb 9.6 oz (89.6 kg)   LMP 05/30/2023   SpO2 98%   BMI 36.14 kg/m      Physical Exam Vitals and nursing note reviewed.  Cardiovascular:     Rate and Rhythm: Normal rate.      Pulses: Normal pulses.  Pulmonary:     Effort: Pulmonary effort is normal.  Neurological:     Mental Status: She is alert and oriented to person, place, and time.  Psychiatric:        Attention and Perception: Attention normal.        Mood and Affect: Mood is depressed. Affect is blunt.        Behavior: Behavior is cooperative.        Cognition and Memory: Cognition and memory normal.     No results found for any visits on 06/15/23.    Assessment & Plan:    Problem List Items Addressed This Visit     GAD (generalized anxiety disorder)   Increased anxiety about her health. Presents with multiple physical symptoms- headache, myalgia, insomnia, lack of motivation, fatigue. She opted not to take vistaril  as prescribed-stated she does not want any medication. She has not scheduled appointment with therapist. Intermittent headache (right temple) with light headedness, fatigue, symptoms occur midday.Last eye  exam 01/2023, use of corrective lens. Works from home on a computer. Symptoms improve with food consumption. She admits to skipping meals-  breakfast and lunch. Her first meal is after working hour- after 3pm. She states she is skipping meals in an attempt to loose weight. Also complaints of insomnia-difficulty falling asleep. Start she does not want any prescription medication She is concerned of possible lupus, heart disease and aneurysm due to her Fhx. We discussed signs of possible autoimmune disorder, MI and potential aneurysm rupture. We also discussed risk factors of heart disease and aneurysm. I advised about importance of lifestyle modifications which will help minimize her risk for heart disease or stroke. I provided printed information Advised to schedule appointment with counselor. Advised to Use melatonin 5mg  or magnesium glycinate 400mg  at bedtime as needed for sleep. Avoid skipping meals. Maintain 64oz of water daily. Maintain daily exercise and heart healthy diet       Other Visit Diagnoses       Nonintractable episodic headache, unspecified headache type    -  Primary     Class 2 obesity due to excess calories without serious comorbidity with body mass index (BMI) of 36.0 to 36.9 in adult       Relevant Orders   Amb Ref to Medical Weight Management      Return in about 3 months (around 09/14/2023) for CPE (fasting).     Kathrene Parents, NP

## 2023-08-11 ENCOUNTER — Encounter (INDEPENDENT_AMBULATORY_CARE_PROVIDER_SITE_OTHER): Payer: Self-pay | Admitting: Adult Health

## 2023-08-11 ENCOUNTER — Ambulatory Visit (INDEPENDENT_AMBULATORY_CARE_PROVIDER_SITE_OTHER): Admitting: Adult Health

## 2023-08-11 VITALS — BP 116/70 | HR 99 | Temp 98.1°F | Ht 62.0 in | Wt 195.0 lb

## 2023-08-11 DIAGNOSIS — F411 Generalized anxiety disorder: Secondary | ICD-10-CM

## 2023-08-11 DIAGNOSIS — Z6835 Body mass index (BMI) 35.0-35.9, adult: Secondary | ICD-10-CM

## 2023-08-11 DIAGNOSIS — E669 Obesity, unspecified: Secondary | ICD-10-CM

## 2023-08-11 DIAGNOSIS — E559 Vitamin D deficiency, unspecified: Secondary | ICD-10-CM

## 2023-08-11 DIAGNOSIS — D500 Iron deficiency anemia secondary to blood loss (chronic): Secondary | ICD-10-CM

## 2023-08-11 NOTE — Progress Notes (Signed)
 Office: [phone]  /  Fax: [phone]   Initial Visit    Daisy [name] was seen in clinic today to evaluate for obesity. She is interested in losing weight to improve overall health and reduce the risk of weight related complications. She presents today to review program treatment options, initial physical assessment, and evaluation.      She was referred by: PCP  When asked what else they would like to accomplish? She states: Adopt healthier eating patterns, Improve energy levels and physical activity, Improve quality of life, Improve appearance, and Current Weight 195 lbs, Goal Weight 155-160 lbs  When asked how has your weight affected you? She states: Contributed to medical problems, Contributed to orthopedic problems or mobility issues, Having fatigue, Having poor endurance, Problems with eating patterns, and Has affected mood   Weight history: Unexplained weight gain the last 12 mmonths  Highest weight: 195 lbs  Some associated conditions: Vitamin D Deficiency  Contributing factors: family history of obesity, disruption of circadian rhythm / sleep disordered breathing, consumption of processed foods, reduced physical acitivity, and eating patterns  Weight promoting medications identified: None  Prior weight loss attempts: None  Current nutrition plan: None  Current level of physical activity: Other: YouTube 30-93mins 1-2 times weekly  Current or previous pharmacotherapy: None  Response to medication: Never tried medications   Past medical history includes:  History reviewed. No pertinent past medical history.   Objective    BP 116/70   Pulse 99   Temp 98.1 F (36.7 C)   Ht 5' 2 (1.575 m)   Wt 195 lb (88.5 kg)   SpO2 100%   BMI 35.67 kg/m  She was weighed on the bioimpedance scale: Body mass index is 35.67 kg/m.  Body Fat%:43.2, Visceral Fat Rating:11, Weight trend over the last 12 months: Decreasing  General:  Alert, oriented and cooperative. Patient  is in no acute distress.  Respiratory: Normal respiratory effort, no problems with respiration noted   Gait: able to ambulate independently  Mental Status: Normal mood and affect. Normal behavior. Normal judgment and thought content.   DIAGNOSTIC DATA REVIEWED:  BMET    Component Value Date/Time   NA 136 06/01/2023 1151   K 4.0 06/01/2023 1151   CL 104 06/01/2023 1151   CO2 24 06/01/2023 1151   GLUCOSE 74 06/01/2023 1151   BUN 11 06/01/2023 1151   CREATININE 0.75 06/01/2023 1151   CALCIUM 9.0 06/01/2023 1151   GFRNONAA >60 02/11/2007 2044   GFRAA  02/11/2007 2044    >60        The eGFR has been calculated using the MDRD equation. This calculation has not been validated in all clinical   Lab Results  Component Value Date   HGBA1C 5.0 04/10/2021   No results found for: INSULIN CBC    Component Value Date/Time   WBC 6.3 06/01/2023 1151   RBC 4.38 06/01/2023 1151   HGB 12.8 06/01/2023 1151   HCT 39.4 06/01/2023 1151   PLT 322.0 06/01/2023 1151   MCV 89.8 06/01/2023 1151   MCHC 32.5 06/01/2023 1151   RDW 14.8 06/01/2023 1151   Iron /TIBC/Ferritin/ %Sat    Component Value Date/Time   IRON  57 06/01/2023 1151   TIBC 347 06/01/2023 1151   FERRITIN 18 06/01/2023 1151   IRONPCTSAT 16 06/01/2023 1151   Lipid Panel     Component Value Date/Time   CHOL 140 05/28/2022 1129   TRIG 45.0 05/28/2022 1129   HDL 59.70 05/28/2022 1129   CHOLHDL  2 05/28/2022 1129   VLDL 9.0 05/28/2022 1129   LDLCALC 72 05/28/2022 1129   Hepatic Function Panel     Component Value Date/Time   PROT 7.0 05/28/2022 1129   ALBUMIN 4.1 05/28/2022 1129   AST 12 05/28/2022 1129   ALT 10 05/28/2022 1129   ALKPHOS 68 05/28/2022 1129   BILITOT 0.5 05/28/2022 1129      Component Value Date/Time   TSH 1.63 06/01/2023 1151     Assessment and Plan   GAD (generalized anxiety disorder)  Iron  deficiency anemia secondary to blood loss (chronic)  Vitamin D deficiency  Obesity (BMI 30-39.9),  STARTING BMI 35.7    Assessment and Plan                 Obesity Treatment / Action Plan:  Patient will work on garnering support from family and friends to begin weight loss journey. Will work on eliminating or reducing the presence of highly palatable, calorie dense foods in the home. Will complete provided nutritional and psychosocial assessment questionnaire before the next appointment. Will be scheduled for indirect calorimetry to determine resting energy expenditure in a fasting state.  This will allow us  to create a reduced calorie, high-protein meal plan to promote loss of fat mass while preserving muscle mass. Counseled on the health benefits of losing 5%-15% of total body weight. Was counseled on nutritional approaches to weight loss and benefits of reducing processed foods and consuming plant-based foods and high quality protein as part of nutritional weight management. Was counseled on pharmacotherapy and role as an adjunct in weight management.   Obesity Education Performed Today:  She was weighed on the bioimpedance scale and results were discussed and documented in the synopsis.  We discussed obesity as a disease and the importance of a more detailed evaluation of all the factors contributing to the disease.  We discussed the importance of long term lifestyle changes which include nutrition, exercise and behavioral modifications as well as the importance of customizing this to her specific health and social needs.  We discussed the benefits of reaching a healthier weight to alleviate the symptoms of existing conditions and reduce the risks of the biomechanical, metabolic and psychological effects of obesity.  We reviewed the four pillars of obesity medicine and importance of using a multimodal approach.  We reviewed the basic principles in weight management.   Daisy [name] appears to be in the action stage of change and states they are ready to start intensive  lifestyle modifications and behavioral modifications.  I have spent 30 minutes in the care of the patient today including: 5 minutes before the visit reviewing and preparing the chart. 20 minutes face-to-face assessing and reviewing listed medical problems as outlined in obesity care plan, providing nutritional and behavioral counseling on topics outlined in the obesity care plan, independently interpreting test results and goals of care, as described in assessment and plan, and reviewing and discussing biometric information and progress 5 minutes after the visit updating chart and documentation of encounter.  Reviewed by clinician on day of visit: allergies, medications, problem list, medical history, surgical history, family history, social history, and previous encounter notes pertinent to obesity diagnosis.  Rosaland Shiffman d. Jihan Mellette, NP-C

## 2023-08-14 ENCOUNTER — Encounter: Payer: Self-pay | Admitting: Nurse Practitioner

## 2023-09-23 ENCOUNTER — Encounter: Admitting: Nurse Practitioner

## 2023-12-10 ENCOUNTER — Encounter: Admitting: Nurse Practitioner

## 2024-01-03 ENCOUNTER — Other Ambulatory Visit: Payer: Self-pay | Admitting: Nurse Practitioner

## 2024-01-03 ENCOUNTER — Other Ambulatory Visit (HOSPITAL_COMMUNITY): Payer: Self-pay

## 2024-01-03 DIAGNOSIS — R21 Rash and other nonspecific skin eruption: Secondary | ICD-10-CM

## 2024-01-03 MED ORDER — CLOTRIMAZOLE-BETAMETHASONE 1-0.05 % EX CREA
1.0000 | TOPICAL_CREAM | Freq: Two times a day (BID) | CUTANEOUS | 0 refills | Status: DC
  Filled 2024-01-03: qty 45, 14d supply, fill #0

## 2024-03-08 ENCOUNTER — Encounter (HOSPITAL_COMMUNITY): Payer: Self-pay | Admitting: Emergency Medicine

## 2024-03-08 ENCOUNTER — Ambulatory Visit (HOSPITAL_COMMUNITY)
Admission: EM | Admit: 2024-03-08 | Discharge: 2024-03-08 | Disposition: A | Discharge status: Home / Self Care | Attending: Nurse Practitioner | Admitting: Nurse Practitioner

## 2024-03-08 ENCOUNTER — Other Ambulatory Visit: Payer: Self-pay

## 2024-03-08 ENCOUNTER — Ambulatory Visit: Payer: Self-pay

## 2024-03-08 ENCOUNTER — Encounter (HOSPITAL_COMMUNITY): Payer: Self-pay

## 2024-03-08 ENCOUNTER — Emergency Department (HOSPITAL_COMMUNITY)
Admission: EM | Admit: 2024-03-08 | Discharge: 2024-03-09 | Discharge status: Against Medical Advice | Attending: Emergency Medicine | Admitting: Emergency Medicine

## 2024-03-08 ENCOUNTER — Encounter: Payer: Self-pay | Admitting: Nurse Practitioner

## 2024-03-08 DIAGNOSIS — Z5321 Procedure and treatment not carried out due to patient leaving prior to being seen by health care provider: Secondary | ICD-10-CM | POA: Diagnosis not present

## 2024-03-08 DIAGNOSIS — M549 Dorsalgia, unspecified: Secondary | ICD-10-CM | POA: Diagnosis not present

## 2024-03-08 DIAGNOSIS — R1084 Generalized abdominal pain: Secondary | ICD-10-CM

## 2024-03-08 DIAGNOSIS — R300 Dysuria: Secondary | ICD-10-CM | POA: Diagnosis not present

## 2024-03-08 DIAGNOSIS — R1030 Lower abdominal pain, unspecified: Secondary | ICD-10-CM | POA: Diagnosis not present

## 2024-03-08 DIAGNOSIS — R197 Diarrhea, unspecified: Secondary | ICD-10-CM | POA: Insufficient documentation

## 2024-03-08 DIAGNOSIS — R1031 Right lower quadrant pain: Secondary | ICD-10-CM | POA: Diagnosis not present

## 2024-03-08 DIAGNOSIS — N898 Other specified noninflammatory disorders of vagina: Secondary | ICD-10-CM | POA: Diagnosis not present

## 2024-03-08 DIAGNOSIS — R350 Frequency of micturition: Secondary | ICD-10-CM | POA: Diagnosis not present

## 2024-03-08 DIAGNOSIS — Z3202 Encounter for pregnancy test, result negative: Secondary | ICD-10-CM

## 2024-03-08 DIAGNOSIS — R110 Nausea: Secondary | ICD-10-CM | POA: Insufficient documentation

## 2024-03-08 DIAGNOSIS — R109 Unspecified abdominal pain: Secondary | ICD-10-CM | POA: Diagnosis present

## 2024-03-08 DIAGNOSIS — M7989 Other specified soft tissue disorders: Secondary | ICD-10-CM | POA: Diagnosis not present

## 2024-03-08 HISTORY — DX: Anemia, unspecified: D64.9

## 2024-03-08 LAB — URINALYSIS, ROUTINE W REFLEX MICROSCOPIC
Bilirubin Urine: NEGATIVE
Glucose, UA: NEGATIVE mg/dL
Ketones, ur: NEGATIVE mg/dL
Nitrite: NEGATIVE
Protein, ur: NEGATIVE mg/dL
Specific Gravity, Urine: 1.019 (ref 1.005–1.030)
pH: 5 (ref 5.0–8.0)

## 2024-03-08 LAB — POCT URINE DIPSTICK
Bilirubin, UA: NEGATIVE
Glucose, UA: NEGATIVE mg/dL
Ketones, POC UA: NEGATIVE mg/dL
Leukocytes, UA: NEGATIVE
Nitrite, UA: NEGATIVE
POC PROTEIN,UA: NEGATIVE
Spec Grav, UA: 1.015
Urobilinogen, UA: 0.2 U/dL
pH, UA: 6

## 2024-03-08 LAB — COMPREHENSIVE METABOLIC PANEL WITH GFR
ALT: 10 U/L (ref 0–44)
AST: 17 U/L (ref 15–41)
Albumin: 4.2 g/dL (ref 3.5–5.0)
Alkaline Phosphatase: 82 U/L (ref 38–126)
Anion gap: 10 (ref 5–15)
BUN: 10 mg/dL (ref 6–20)
CO2: 25 mmol/L (ref 22–32)
Calcium: 9.1 mg/dL (ref 8.9–10.3)
Chloride: 103 mmol/L (ref 98–111)
Creatinine, Ser: 0.74 mg/dL (ref 0.44–1.00)
GFR, Estimated: >60 mL/min
Glucose, Bld: 82 mg/dL (ref 70–99)
Potassium: 3.9 mmol/L (ref 3.5–5.1)
Sodium: 137 mmol/L (ref 135–145)
Total Bilirubin: 0.6 mg/dL (ref 0.0–1.2)
Total Protein: 7.6 g/dL (ref 6.5–8.1)

## 2024-03-08 LAB — CBC WITH DIFFERENTIAL/PLATELET
Abs Immature Granulocytes: 0.05 K/uL (ref 0.00–0.07)
Basophils Absolute: 0 K/uL (ref 0.0–0.1)
Basophils Relative: 0 %
Eosinophils Absolute: 0.1 K/uL (ref 0.0–0.5)
Eosinophils Relative: 1 %
HCT: 40 % (ref 36.0–46.0)
Hemoglobin: 12.7 g/dL (ref 12.0–15.0)
Immature Granulocytes: 1 %
Lymphocytes Relative: 19 %
Lymphs Abs: 1.9 K/uL (ref 0.7–4.0)
MCH: 28.1 pg (ref 26.0–34.0)
MCHC: 31.8 g/dL (ref 30.0–36.0)
MCV: 88.5 fL (ref 80.0–100.0)
Monocytes Absolute: 0.6 K/uL (ref 0.1–1.0)
Monocytes Relative: 6 %
Neutro Abs: 7.1 K/uL (ref 1.7–7.7)
Neutrophils Relative %: 73 %
Platelets: 350 K/uL (ref 150–400)
RBC: 4.52 MIL/uL (ref 3.87–5.11)
RDW: 13.4 % (ref 11.5–15.5)
WBC: 9.8 K/uL (ref 4.0–10.5)
nRBC: 0 % (ref 0.0–0.2)

## 2024-03-08 LAB — HCG, SERUM, QUALITATIVE: Preg, Serum: NEGATIVE

## 2024-03-08 LAB — CK: Total CK: 132 U/L (ref 38–234)

## 2024-03-08 LAB — POCT URINE PREGNANCY: Preg Test, Ur: NEGATIVE

## 2024-03-08 NOTE — ED Triage Notes (Signed)
 Patient here with complaints of abdominal pain all over, back pain and the backs of her legs. Patients states that the leg pain started a while ago and her abdominal pain started today. She has been having diarrhea. She is slightly nauseous, Denies fever.

## 2024-03-08 NOTE — Discharge Instructions (Signed)
 Please go directly to the Emergency Room for further evaluation and management of the pain you are having

## 2024-03-08 NOTE — ED Provider Triage Note (Signed)
 Emergency Medicine Provider Triage Evaluation Note  Daisy [name] , a 44 y.o. female  was evaluated in triage.  Pt complains of abdominal pain around the lower abdomen wrapping around to her back and pain the back of her legs x 1 day.  Mild nausea, 1 episode of loose stools.  One episode of dysuria this morning.  Review of Systems  Positive: Nausea, diarrhea x 1, abdominal pain, dysuria Negative: Fever, chills, vomiting, vaginal discharge  Physical Exam  BP (!) 162/94 (BP Location: Right Wrist)   Pulse 83   Temp 98.3 F (36.8 C)   Resp 18   LMP 01/20/2024 (Exact Date)   SpO2 100%  Gen:   Awake, no distress   Resp:  Normal effort  MSK:   Moves extremities without difficulty  Other:    Medical Decision Making  Medically screening exam initiated at 5:46 PM.  Appropriate orders placed.  Daisy [name] was informed that the remainder of the evaluation will be completed by another provider, this initial triage assessment does not replace that evaluation, and the importance of remaining in the ED until their evaluation is complete.  Labs ordered   Francis Ileana SAILOR, PA-C 03/08/24 8251

## 2024-03-08 NOTE — ED Triage Notes (Signed)
 Pt c/o abd pain that radiates to her back and now in mid stomach. Comes in waves. Hasn't taken any medications for symptoms. Reports nausea. Had BM this morning that was kinda watery, kinda hurts to urinate.   Since December having bilateral leg pain and headaches. Hasn't taken medications for symptoms.   Reports LMP 01/20/24, had three home test that was negative.

## 2024-03-08 NOTE — ED Notes (Signed)
 Patient is being discharged from the Urgent Care and sent to the Emergency Department via POV. Per Harlene Code, NP, patient is in need of higher level of care due to abdominal pain. Patient is aware and verbalizes understanding of plan of care.  Vitals:   03/08/24 1616  BP: 133/77  Pulse: 76  Resp: 15  Temp: 98.1 F (36.7 C)  SpO2: 100%

## 2024-03-08 NOTE — Telephone Encounter (Signed)
 FYI Only or Action Required?: FYI only for provider: ED advised.  Patient was last seen in primary care on 08/11/2023 by Daisy Rockie BIRCH, NP.  Called Nurse Triage reporting Leg Pain and Abdominal Pain.  Symptoms began a week ago.  Interventions attempted: Nothing.  Symptoms are: gradually worsening.  Triage Disposition: See HCP Within 4 Hours (Or PCP Triage)  Patient/caregiver understands and will follow disposition?: Yes    Copied from CRM #[id number]. Topic: Clinical - Red Word Triage >> Mar 08, 2024 11:43 AM Deaijah H wrote: Red Word that prompted transfer to Nurse Triage: Lower stomach hurting, striking pain through vagina, and burning when urinating.     Reason for Disposition  Pain or burning with passing urine (urination)  Answer Assessment - Initial Assessment Questions Pt called in request appt for R flank pain radiating to abdomen and vagina, burning with urination x 1 week and R leg pain x 1 month. Pt states she suspected she was pregnant d/t last menstrual cycle 01/20/24 but pregnancy test x 3 negative. Pt did schedule with abortion clinic, 03/14/24 but pt requesting urgent appt with PCP. Discussed no appt with PCP until march, appts in clinic Friday but based on symptoms discussed pt should seek higher level of care. Discussed pt does still have appendix, no fever at this time but discussed pain would warrant evaluation and possible U/S to r/o rupture or inflammation. Pt requested appt Friday but discussed she should be seen in ED asap. She voiced understanding.     1. LOCATION: Where does it hurt? (e.g., left, right)     Right flank  2. ONSET: When did the pain start?     1 week ago   3. SEVERITY: How bad is the pain? (e.g., Scale 1-10; mild, moderate, or severe)     Moderate   4. PATTERN: Does the pain come and go, or is it constant?      Constant   5. CAUSE: What do you think is causing the pain?     Unsure; pt states she suspected pregnancy but preg  test x 3 negative   6. OTHER SYMPTOMS:  Do you have any other symptoms? (e.g., fever, abdomen pain, vomiting, leg weakness, burning with urination, blood in urine)     Abdominal pain, nausea/dry heaving, leg pain, burning with urination   7. PREGNANCY:  Is there any chance you are pregnant? When was your last menstrual period?     01/25/24; preg test x 3 negative. Pt scheduled with abortion clinic 03/14/24  Protocols used: Flank Pain-A-AH

## 2024-03-08 NOTE — ED Provider Notes (Signed)
 " [facility]    CSN: [id number] Arrival date & time: 03/08/24  1440      History   Chief Complaint Chief Complaint  Patient presents with   Abdominal Pain   Leg Pain   Headache    HPI Daisy [name] is a 44 y.o. female.   Patient presents today for abdominal pain ongoing since she woke up this morning.  She reports the pain today is around her back.  Her entire abdomen is hurting and she describes the pain as a 7 out of 10 and a constant throb.  Feels bloated. Reports nausea, however no vomiting.  She has been able to eat and drink today without vomiting.  She endorses dysuria, however denies new urinary frequency, urgency, or vaginal discharge.  Endorses watery bowel movement this morning without blood; no significant diarrhea since then.  Reports that she is sexually active approximately 1 month ago and has not had a menstrual cycle since prior to that.  Reports multiple negative pregnancy test at home.    Also concerned about back and leg pain that have been going for 1 month.     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      Home Medications    Prior to Admission medications  Medication Sig Start Date End Date Taking? Authorizing Provider  IRON  CR PO Take 125 mLs by mouth daily.    [provider]    Family History Family History  Problem Relation Age of Onset   Breast cancer Mother    Hypertension Mother    Cancer Mother 45       breast   Heart disease Father 56    Diabetes Father    Lupus Brother 30   Cancer Maternal Aunt        beast cancer   Aneurysm Paternal Aunt 39   Hypertension Maternal Grandmother    Diabetes Maternal Grandmother    Dementia Maternal Grandfather 86    Social History Social History[1]   Allergies   Penicillins   Review of Systems Review of Systems Per HPI  Physical Exam Triage Vital Signs ED Triage Vitals  Encounter Vitals Group     BP 03/08/24 1616 133/77     Girls Systolic BP Percentile --      Girls Diastolic BP Percentile --      Boys Systolic BP Percentile --      Boys Diastolic BP Percentile --      Pulse Rate 03/08/24 1616 76     Resp 03/08/24 1616 15     Temp 03/08/24 1616 98.1 F (36.7 C)     Temp Source 03/08/24 1616 Oral     SpO2 03/08/24 1616 100 %     Weight --      Height --      Head Circumference --      Peak Flow --      Pain Score 03/08/24 1613 7  Pain Loc --      Pain Education --      Exclude from Growth Chart --    No data found.  Updated Vital Signs BP 133/77 (BP Location: Left Arm)   Pulse 76   Temp 98.1 F (36.7 C) (Oral)   Resp 15   LMP 01/20/2024 (Exact Date)   SpO2 100%   Visual Acuity Right Eye Distance:   Left Eye Distance:   Bilateral Distance:    Right Eye Near:   Left Eye Near:    Bilateral Near:     Physical Exam Vitals and nursing note reviewed.  Constitutional:      General: She is not in acute distress.    Appearance: She is well-developed. She is not ill-appearing, toxic-appearing or diaphoretic.     Comments: Appears to be in pain; guarding abdomen in the sitting position when I enter the room  HENT:     Head: Normocephalic and atraumatic.     Mouth/Throat:     Mouth: Mucous membranes are moist.     Pharynx: Oropharynx is clear.  Eyes:     Extraocular Movements: Extraocular movements intact.  Cardiovascular:     Rate and Rhythm: Normal rate.  Pulmonary:     Effort: Pulmonary effort is normal. No respiratory distress.     Breath  sounds: Normal breath sounds. No wheezing, rhonchi or rales.  Abdominal:     General: Abdomen is flat. Bowel sounds are decreased.     Palpations: Abdomen is soft.     Tenderness: There is abdominal tenderness. There is guarding. There is no right CVA tenderness, left CVA tenderness or rebound.  Skin:    General: Skin is warm and dry.     Capillary Refill: Capillary refill takes less than 2 seconds.     Coloration: Skin is not cyanotic, jaundiced or pale.     Findings: No rash.  Neurological:     Mental Status: She is alert and oriented to person, place, and time.  Psychiatric:        Behavior: Behavior is cooperative.      UC Treatments / Results  Labs (all labs ordered are listed, but only abnormal results are displayed) Labs Reviewed  POCT URINE DIPSTICK - Abnormal; Notable for the following components:      Result Value   Blood, UA trace-lysed (*)    All other components within normal limits  POCT URINE PREGNANCY    EKG   Radiology No results found.  Procedures Procedures (including critical care time)  Medications Ordered in UC Medications - No data to display  Initial Impression / Assessment and Plan / UC Course  I have reviewed the triage vital signs and the nursing notes.  Pertinent labs & imaging results that were available during my care of the patient were reviewed by me and considered in my medical decision making (see chart for details).   Patient is a non-toxic appearing 44 year old female presenting for abdominal pain today.  She appears to be in pain when I enter the room.  Vital signs are stable today.  On examination, she is guarding her entire abdomen.  Urine same is negative for signs of infection and she has no CVA tenderness.  We discussed urgent care scope and limited resources to diagnose abdominal complaints with certainty, Given symptoms and exam, I recommended further evaluation and management in the Emergency Room.  Patient is in agreement to  plan and is safe to transport via private  vehicle at this time.  The patient was given the opportunity to ask questions.  All questions answered to their satisfaction.  The patient is in agreement to this plan.   Final Clinical Impressions(s) / UC Diagnoses   Final diagnoses:  Generalized abdominal pain  Urine pregnancy test negative     Discharge Instructions      Please go directly to the Emergency Room for further evaluation and management of the pain you are having     ED Prescriptions   None    PDMP not reviewed this encounter.     [1]  Social History Tobacco Use   Smoking status: Never   Smokeless tobacco: Never  Vaping Use   Vaping status: Never Used  Substance Use Topics   Alcohol use: No   Drug use: Never     Chandra Harlene LABOR, NP 03/09/24 1014  "

## 2024-03-08 NOTE — Telephone Encounter (Signed)
 Noted. Patient advised by nurse triage to go to ED.

## 2024-03-09 ENCOUNTER — Emergency Department (HOSPITAL_COMMUNITY)

## 2024-03-09 ENCOUNTER — Encounter (HOSPITAL_COMMUNITY): Payer: Self-pay | Admitting: Emergency Medicine

## 2024-03-09 ENCOUNTER — Other Ambulatory Visit: Payer: Self-pay

## 2024-03-09 ENCOUNTER — Emergency Department (HOSPITAL_COMMUNITY)
Admission: EM | Admit: 2024-03-09 | Discharge: 2024-03-09 | Disposition: A | Discharge status: Home / Self Care | Attending: Emergency Medicine | Admitting: Emergency Medicine

## 2024-03-09 DIAGNOSIS — R1031 Right lower quadrant pain: Secondary | ICD-10-CM | POA: Insufficient documentation

## 2024-03-09 DIAGNOSIS — R197 Diarrhea, unspecified: Secondary | ICD-10-CM | POA: Insufficient documentation

## 2024-03-09 DIAGNOSIS — N3000 Acute cystitis without hematuria: Secondary | ICD-10-CM

## 2024-03-09 DIAGNOSIS — M7989 Other specified soft tissue disorders: Secondary | ICD-10-CM | POA: Insufficient documentation

## 2024-03-09 DIAGNOSIS — R110 Nausea: Secondary | ICD-10-CM | POA: Insufficient documentation

## 2024-03-09 DIAGNOSIS — R300 Dysuria: Secondary | ICD-10-CM | POA: Insufficient documentation

## 2024-03-09 DIAGNOSIS — R350 Frequency of micturition: Secondary | ICD-10-CM | POA: Insufficient documentation

## 2024-03-09 DIAGNOSIS — N926 Irregular menstruation, unspecified: Secondary | ICD-10-CM

## 2024-03-09 DIAGNOSIS — N898 Other specified noninflammatory disorders of vagina: Secondary | ICD-10-CM | POA: Insufficient documentation

## 2024-03-09 DIAGNOSIS — R140 Abdominal distension (gaseous): Secondary | ICD-10-CM

## 2024-03-09 LAB — CBC WITH DIFFERENTIAL/PLATELET
Abs Immature Granulocytes: 0.03 K/uL (ref 0.00–0.07)
Basophils Absolute: 0 K/uL (ref 0.0–0.1)
Basophils Relative: 0 %
Eosinophils Absolute: 0.1 K/uL (ref 0.0–0.5)
Eosinophils Relative: 1 %
HCT: 42 % (ref 36.0–46.0)
Hemoglobin: 13.5 g/dL (ref 12.0–15.0)
Immature Granulocytes: 0 %
Lymphocytes Relative: 19 %
Lymphs Abs: 2 K/uL (ref 0.7–4.0)
MCH: 28.4 pg (ref 26.0–34.0)
MCHC: 32.1 g/dL (ref 30.0–36.0)
MCV: 88.2 fL (ref 80.0–100.0)
Monocytes Absolute: 0.6 K/uL (ref 0.1–1.0)
Monocytes Relative: 6 %
Neutro Abs: 7.6 K/uL (ref 1.7–7.7)
Neutrophils Relative %: 74 %
Platelets: 370 K/uL (ref 150–400)
RBC: 4.76 MIL/uL (ref 3.87–5.11)
RDW: 13.4 % (ref 11.5–15.5)
WBC: 10.3 K/uL (ref 4.0–10.5)
nRBC: 0 % (ref 0.0–0.2)

## 2024-03-09 LAB — URINALYSIS, ROUTINE W REFLEX MICROSCOPIC
Bilirubin Urine: NEGATIVE
Glucose, UA: NEGATIVE mg/dL
Ketones, ur: 20 mg/dL — AB
Nitrite: NEGATIVE
Protein, ur: NEGATIVE mg/dL
Specific Gravity, Urine: 1.018 (ref 1.005–1.030)
pH: 5 (ref 5.0–8.0)

## 2024-03-09 LAB — COMPREHENSIVE METABOLIC PANEL WITH GFR
ALT: 11 U/L (ref 0–44)
AST: 21 U/L (ref 15–41)
Albumin: 4.4 g/dL (ref 3.5–5.0)
Alkaline Phosphatase: 94 U/L (ref 38–126)
Anion gap: 12 (ref 5–15)
BUN: 10 mg/dL (ref 6–20)
CO2: 24 mmol/L (ref 22–32)
Calcium: 9.6 mg/dL (ref 8.9–10.3)
Chloride: 103 mmol/L (ref 98–111)
Creatinine, Ser: 0.77 mg/dL (ref 0.44–1.00)
GFR, Estimated: >60 mL/min
Glucose, Bld: 89 mg/dL (ref 70–99)
Potassium: 4 mmol/L (ref 3.5–5.1)
Sodium: 138 mmol/L (ref 135–145)
Total Bilirubin: 0.7 mg/dL (ref 0.0–1.2)
Total Protein: 8.3 g/dL — ABNORMAL HIGH (ref 6.5–8.1)

## 2024-03-09 LAB — WET PREP, GENITAL
Clue Cells Wet Prep HPF POC: NONE SEEN
Sperm: NONE SEEN
Trich, Wet Prep: NONE SEEN
WBC, Wet Prep HPF POC: <10
Yeast Wet Prep HPF POC: NONE SEEN

## 2024-03-09 LAB — PREGNANCY, URINE: Preg Test, Ur: NEGATIVE

## 2024-03-09 LAB — LIPASE, BLOOD: Lipase: 49 U/L (ref 11–51)

## 2024-03-09 MED ORDER — ACETAMINOPHEN 325 MG PO TABS
650.0000 mg | ORAL_TABLET | Freq: Once | ORAL | Status: AC
  Administered 2024-03-09: 650 mg via ORAL
  Filled 2024-03-09: qty 2

## 2024-03-09 MED ORDER — IOHEXOL 300 MG/ML  SOLN
100.0000 mL | Freq: Once | INTRAMUSCULAR | Status: AC | PRN
  Administered 2024-03-09: 100 mL via INTRAVENOUS

## 2024-03-09 MED ORDER — NITROFURANTOIN MONOHYD MACRO 100 MG PO CAPS: 100.0000 mg | ORAL_CAPSULE | Freq: Two times a day (BID) | ORAL | 0 refills | Status: AC

## 2024-03-09 NOTE — Discharge Instructions (Addendum)
 Evaluated for abdominal pain.  Workup included labs, imaging, physical exam.  Imaging of your abdomen did not show any evidence of appendicitis, other abdominal infection.  The ultrasound did not show evidence of ovarian torsion.  Your urinalysis was positive for bacteria so we will treat you for a UTI.  For your abdominal bloating and abdominal pain, try using heating pad and taking Tylenol  as needed.  Please return back to the ED if you have any fevers, chills, worsening abdominal pain, intractable nausea or vomiting.  Thank you for allowing me to be part of your care.

## 2024-03-09 NOTE — ED Notes (Signed)
Pt was seen leaving 

## 2024-03-09 NOTE — ED Provider Triage Note (Signed)
 Emergency Medicine Provider Triage Evaluation Note  Jaidah Lomax , a 44 y.o. female  was evaluated in triage.  Pt complains of abd pain, chills, vag discharge.  Review of Systems  Positive: Abd pain general, chills, vaginal discharge, one episode diarrhea, low back/leg pain Negative: Fever, CP, SHOB  Physical Exam  BP (!) 162/92 (BP Location: Left Arm)   Pulse 82   Temp 97.9 F (36.6 C) (Oral)   Resp 16   LMP 01/20/2024 (Exact Date)   SpO2 100%  Gen:   Awake, no distress   Resp:  Normal effort  MSK:   Moves extremities without difficulty  Other:    Medical Decision Making  Medically screening exam initiated at 4:20 PM.  Appropriate orders placed.  Ranae Casebier was informed that the remainder of the evaluation will be completed by another provider, this initial triage assessment does not replace that evaluation, and the importance of remaining in the ED until their evaluation is complete.  Labs and imaging ordered   Francis Ileana LOISE DEVONNA 03/09/24 1622

## 2024-03-09 NOTE — ED Provider Notes (Signed)
 " [place] EMERGENCY DEPARTMENT AT [facility] Provider Note   CSN: [id number] Arrival date & time: 03/09/24  1609     Patient presents with: Abdominal Pain and Leg Pain   Daisy [name] is a 44 y.o. female.   Patient presents to the ED after being seen in urgent care 03/08/2024.  Urgent care encounter: 44 year old female complaining of abdominal pain and lower abdomen wraparound her back, mild nausea, 1 episode of loose stool, dysuria, vaginal discharge.  Patient was discharged from urgent care and recommended following up at the emergency room for further evaluation, however patient went home because she had to go to work.  After work 03/09/2024, she stated that her symptoms have been stable if not a little bit worse and so she decided to come back to the emergency room for further evaluation.  Her complaints today are the same. - Abdominal pain: Right lower quadrant abdominal pain ongoing since 03/08/2024.  Described bloating, constant, nausea without vomiting, 2 episodes of diarrhea.  Denies fevers/chills.  Denies sick contacts. Has had decreased appetite since pain began.  - Vaginal discharge: She stated that she has been having vaginal discharge for couple weeks.  Describes discharge as odorous and white/thick/cottage-cheesy. Has 1 female sexual partner, monogamous.  Last menstrual period November 20.  No history of abnormal menstrual cycles before 06/2023.  After 06/2023 she started having irregular menses.  Her first menstruation was around 44 years old. Denies blood.  -Urination: States increased urinary frequency, dysuria that began yesterday - LE swelling. Stated she works from home and notices bilateral LE swelling after spending all day sitting.    Abdominal Pain Leg Pain      Prior to Admission medications  Medication Sig Start Date End Date Taking? Authorizing Provider  IRON  CR PO Take 125 mLs by mouth daily.    [provider]    Allergies: Penicillins     Review of Systems  Gastrointestinal:  Positive for abdominal pain.    Updated Vital Signs BP (!) 162/92 (BP Location: Left Arm)   Pulse 82   Temp 97.9 F (36.6 C) (Oral)   Resp 16   LMP 01/20/2024 (Exact Date)   SpO2 100%   Physical Exam Constitutional:      Appearance: She is not ill-appearing or toxic-appearing.     Comments: Tearful, non-toxic appearing.   Cardiovascular:     Rate and Rhythm: Normal rate and regular rhythm.     Heart sounds: Normal heart sounds. No murmur heard.    No friction rub. No gallop.  Pulmonary:     Effort: Pulmonary effort is normal.     Breath sounds: Normal breath sounds. No stridor. No wheezing, rhonchi or rales.  Abdominal:     General: Abdomen is protuberant. Bowel sounds are increased.     Palpations: Abdomen is soft.     Tenderness: There is abdominal tenderness in the right lower quadrant and suprapubic area. There is guarding. There is no right CVA tenderness, left CVA tenderness or rebound. Negative signs include Rovsing's sign.  Skin:    General: Skin is warm and dry.     Comments: Normal capillary refill.   Neurological:     Mental Status: She is alert.     (all labs ordered are listed, but only abnormal results are displayed) Labs Reviewed  COMPREHENSIVE METABOLIC PANEL WITH GFR - Abnormal; Notable for the following components:      Result Value   Total Protein 8.3 (*)  All other components within normal limits  WET PREP, GENITAL  LIPASE, BLOOD  CBC WITH DIFFERENTIAL/PLATELET  PREGNANCY, URINE  URINALYSIS, ROUTINE W REFLEX MICROSCOPIC  GC/CHLAMYDIA PROBE AMP ([place]) NOT AT [facility]    EKG: None  Radiology: No results found.   Procedures   Medications Ordered in the ED - No data to display                                  Medical Decision Making This patient is a 44 y.o. female who presents to the ED for concern of abdominal pain, this involves an extensive number of treatment options, and is a  complaint that carries with it a high risk of complications and morbidity. The emergent differential diagnosis prior to evaluation includes, but is not limited to, appendicitis, cholecystitis, colitis, pancreatitis,.  Chlamydia, gonorrhea, trichomonas, Candida, ectopic pregnancy, ovarian torsion, ovarian cyst. This is not an exhaustive differential.   Past Medical History / Co-morbidities / Social History: Generalized anxiety disorder  Physical Exam: Physical exam performed. The pertinent findings include:  Tenderness to right lower quadrant.  Guarding.  No peritoneal signs.  Abdomen soft.  Lab Tests: I ordered, and personally interpreted labs.  The pertinent results include:   - Pregnancy: Negative - CBC: Unremarkable - Lipase: Unremarkable - CMP: Unremarkable - Wet prep: Unremarkable - Urinalysis: Urinalysis:few bacteria, trace leukocytes   Imaging Studies: I ordered imaging studies including and I independently visualized and interpreted imaging - I agree with the radiologist interpretation. Below are the tests ordered and my interpretations: -CT abdomen: Unremarkable -Vaginal ultrasound: No sonographic evidence of ovarian torsion.  Normal ovaries.  Trace free fluid.  EKG/Cardiac Monitoring: Deferred  Medications: I ordered medication including - Tylenol  for pain  Consultations Obtained: None   MDM: Patient presenting after workup from urgent care.  She is endorsing nausea, bloating, vaginal discharge, dysuria, 1 episode of diarrhea. Right lower quadrant abdominal pain and irregular menses (left.  November 20).  Workup at urgent care was grossly negative but imaging was not obtained.  Imaging was obtained here and was unremarkable per CT abdomen and vaginal ultrasound. No evidence of obstruction, appendicitis, cholecystitis, pancreatitis, ovarian torsion.   Pregnancy test negative.  Negative for fevers.  Urinalysis positive for bacteruria - nitrofurantoin  sent to pharmacy. Patient  negative for STI/yeast regarding vaginal discharge.  Return precautions discussed. Encouraged follow-up with her primary care doctor. Patient agreeable with plan and understanding of workup. Patient had no further questions prior to discharge.   I discussed this case with my attending physician Dr. Dasie who cosigned this note including patient's presenting symptoms, physical exam, and planned diagnostics and interventions. Attending physician stated agreement with plan or made changes to plan which were implemented.      Amount and/or Complexity of Data Reviewed Radiology: ordered.  Risk OTC drugs. Prescription drug management.       Final diagnoses:  None    ED Discharge Orders     None          Benuel Braun, DO 03/09/24 2134    Dasie Faden, MD 03/10/24 1027  "

## 2024-03-09 NOTE — ED Triage Notes (Signed)
 Pt presents after being seen at [facility] yesterday for same.  Reports lower abdominal pain, chills, vaginal discharge, one episode of diarrhea, and bilateral leg pain.  Pt was told to go to the ED for further work up.

## 2024-03-09 NOTE — ED Provider Notes (Signed)
 I saw and evaluated the patient, reviewed the resident's note and I agree with the findings and plan.   44 year old female who presents with right abdominal pain times several days that waxes and wanes and worse with certain positions.  No fever or chills.  Abdominal CT today which was negative.  Labs are reassuring here but patient need to have pelvic ultrasound to rule out torsion   Dasie Faden, MD 03/09/24 [place]

## 2024-03-10 LAB — GC/CHLAMYDIA PROBE AMP (~~LOC~~) NOT AT ARMC
Chlamydia: NEGATIVE
Comment: NEGATIVE
Comment: NORMAL
Neisseria Gonorrhea: NEGATIVE

## 2024-04-21 ENCOUNTER — Encounter: Admitting: Nurse Practitioner
